# Patient Record
Sex: Male | Born: 1975 | Race: Black or African American | Hispanic: No | Marital: Single | Smoking: Current some day smoker
Health system: Southern US, Community
[De-identification: ages and names within clinical notes are randomized; demographics above are authoritative.]

## PROBLEM LIST (undated history)

## (undated) DIAGNOSIS — I509 Heart failure, unspecified: Secondary | ICD-10-CM

## (undated) DIAGNOSIS — F1490 Cocaine use, unspecified, uncomplicated: Secondary | ICD-10-CM

## (undated) DIAGNOSIS — I471 Supraventricular tachycardia, unspecified: Secondary | ICD-10-CM

## (undated) DIAGNOSIS — F32A Depression, unspecified: Secondary | ICD-10-CM

## (undated) DIAGNOSIS — F4310 Post-traumatic stress disorder, unspecified: Secondary | ICD-10-CM

## (undated) DIAGNOSIS — F419 Anxiety disorder, unspecified: Secondary | ICD-10-CM

## (undated) DIAGNOSIS — K259 Gastric ulcer, unspecified as acute or chronic, without hemorrhage or perforation: Secondary | ICD-10-CM

## (undated) DIAGNOSIS — K226 Gastro-esophageal laceration-hemorrhage syndrome: Secondary | ICD-10-CM

## (undated) DIAGNOSIS — R634 Abnormal weight loss: Secondary | ICD-10-CM

## (undated) DIAGNOSIS — F329 Major depressive disorder, single episode, unspecified: Secondary | ICD-10-CM

## (undated) DIAGNOSIS — I119 Hypertensive heart disease without heart failure: Secondary | ICD-10-CM

## (undated) DIAGNOSIS — I10 Essential (primary) hypertension: Secondary | ICD-10-CM

## (undated) DIAGNOSIS — E119 Type 2 diabetes mellitus without complications: Secondary | ICD-10-CM

## (undated) DIAGNOSIS — K219 Gastro-esophageal reflux disease without esophagitis: Secondary | ICD-10-CM

## (undated) DIAGNOSIS — K449 Diaphragmatic hernia without obstruction or gangrene: Secondary | ICD-10-CM

## (undated) DIAGNOSIS — J449 Chronic obstructive pulmonary disease, unspecified: Secondary | ICD-10-CM

## (undated) DIAGNOSIS — I8500 Esophageal varices without bleeding: Secondary | ICD-10-CM

## (undated) DIAGNOSIS — Z720 Tobacco use: Secondary | ICD-10-CM

## (undated) HISTORY — DX: Tobacco use: Z72.0

## (undated) HISTORY — DX: Gastro-esophageal laceration-hemorrhage syndrome: K22.6

## (undated) HISTORY — DX: Hypertensive heart disease without heart failure: I11.9

## (undated) HISTORY — DX: Chronic obstructive pulmonary disease, unspecified: J44.9

## (undated) HISTORY — DX: Cocaine use, unspecified, uncomplicated: F14.90

## (undated) HISTORY — DX: Supraventricular tachycardia: I47.1

## (undated) HISTORY — PX: FOOT SURGERY: SHX648

## (undated) HISTORY — DX: Abnormal weight loss: R63.4

## (undated) HISTORY — DX: Esophageal varices without bleeding: I85.00

## (undated) HISTORY — DX: Type 2 diabetes mellitus without complications: E11.9

## (undated) HISTORY — DX: Anxiety disorder, unspecified: F41.9

## (undated) HISTORY — DX: Gastric ulcer, unspecified as acute or chronic, without hemorrhage or perforation: K25.9

## (undated) HISTORY — DX: Diaphragmatic hernia without obstruction or gangrene: K44.9

## (undated) HISTORY — DX: Post-traumatic stress disorder, unspecified: F43.10

## (undated) HISTORY — DX: Supraventricular tachycardia, unspecified: I47.10

---

## 2001-03-26 ENCOUNTER — Emergency Department (HOSPITAL_COMMUNITY): Admission: EM | Admit: 2001-03-26 | Discharge: 2001-03-26 | Payer: Self-pay | Admitting: Emergency Medicine

## 2001-03-26 ENCOUNTER — Encounter: Payer: Self-pay | Admitting: Emergency Medicine

## 2001-04-03 HISTORY — PX: NASAL RECONSTRUCTION: SHX2069

## 2003-03-05 ENCOUNTER — Emergency Department (HOSPITAL_COMMUNITY): Admission: EM | Admit: 2003-03-05 | Discharge: 2003-03-06 | Payer: Self-pay | Admitting: Emergency Medicine

## 2006-07-02 ENCOUNTER — Emergency Department (HOSPITAL_COMMUNITY): Admission: EM | Admit: 2006-07-02 | Discharge: 2006-07-02 | Payer: Self-pay | Admitting: Emergency Medicine

## 2010-12-21 ENCOUNTER — Encounter: Payer: Self-pay | Admitting: *Deleted

## 2010-12-21 ENCOUNTER — Inpatient Hospital Stay (HOSPITAL_COMMUNITY)
Admission: EM | Admit: 2010-12-21 | Discharge: 2010-12-26 | DRG: 370 | Disposition: A | Discharge status: Home / Self Care | Payer: Self-pay | Attending: Internal Medicine | Admitting: Internal Medicine

## 2010-12-21 DIAGNOSIS — R51 Headache: Secondary | ICD-10-CM

## 2010-12-21 DIAGNOSIS — R519 Headache, unspecified: Secondary | ICD-10-CM | POA: Diagnosis present

## 2010-12-21 DIAGNOSIS — K922 Gastrointestinal hemorrhage, unspecified: Secondary | ICD-10-CM

## 2010-12-21 DIAGNOSIS — I10 Essential (primary) hypertension: Secondary | ICD-10-CM | POA: Diagnosis present

## 2010-12-21 DIAGNOSIS — I160 Hypertensive urgency: Secondary | ICD-10-CM

## 2010-12-21 DIAGNOSIS — K226 Gastro-esophageal laceration-hemorrhage syndrome: Principal | ICD-10-CM | POA: Diagnosis present

## 2010-12-21 DIAGNOSIS — R3129 Other microscopic hematuria: Secondary | ICD-10-CM

## 2010-12-21 DIAGNOSIS — F4310 Post-traumatic stress disorder, unspecified: Secondary | ICD-10-CM | POA: Diagnosis present

## 2010-12-21 DIAGNOSIS — K2100 Gastro-esophageal reflux disease with esophagitis, without bleeding: Secondary | ICD-10-CM | POA: Diagnosis present

## 2010-12-21 DIAGNOSIS — E860 Dehydration: Secondary | ICD-10-CM | POA: Diagnosis present

## 2010-12-21 DIAGNOSIS — K920 Hematemesis: Secondary | ICD-10-CM

## 2010-12-21 DIAGNOSIS — R45850 Homicidal ideations: Secondary | ICD-10-CM

## 2010-12-21 DIAGNOSIS — K319 Disease of stomach and duodenum, unspecified: Secondary | ICD-10-CM | POA: Diagnosis present

## 2010-12-21 DIAGNOSIS — I8500 Esophageal varices without bleeding: Secondary | ICD-10-CM | POA: Diagnosis present

## 2010-12-21 HISTORY — DX: Depression, unspecified: F32.A

## 2010-12-21 HISTORY — DX: Essential (primary) hypertension: I10

## 2010-12-21 HISTORY — DX: Gastro-esophageal reflux disease without esophagitis: K21.9

## 2010-12-21 HISTORY — DX: Major depressive disorder, single episode, unspecified: F32.9

## 2010-12-21 MED ORDER — SODIUM CHLORIDE 0.9 % IV BOLUS (SEPSIS)
500.0000 mL | Freq: Once | INTRAVENOUS | Status: AC
  Administered 2010-12-22: 1000 mL via INTRAVENOUS

## 2010-12-21 MED ORDER — PANTOPRAZOLE SODIUM 40 MG IV SOLR
40.0000 mg | Freq: Once | INTRAVENOUS | Status: AC
  Administered 2010-12-21: 40 mg via INTRAVENOUS
  Filled 2010-12-21: qty 40

## 2010-12-21 MED ORDER — SODIUM CHLORIDE 0.9 % IV SOLN: Freq: Once | INTRAVENOUS | Status: DC

## 2010-12-21 NOTE — ED Provider Notes (Addendum)
History     CSN: [id number] Arrival date & time: 12/21/2010 11:08 PM   Chief Complaint  Patient presents with  . Hematemesis     (Include location/radiation/quality/duration/timing/severity/associated sxs/prior treatment) HPI Comments: Seen 2309. Patient states he has had abdominal pain and vomiting of blood periodically for over a year.Pain located in the epigastric area with no radiation. Pain is continuous burning and aching.  He has not sought medical attention and has not taken medications.  Since Friday symptoms have been worse. Unable to take food of fluids without pain and  vomiting.Denies fever, chills, diarrhea, dizziness, syncope. Last BM was Friday.Has had dark stools for a "long time". Rare alcohol intake. No ASA or NSAID use. Recent weight loss of ?15 pounds.  Patient is a 35 y.o. male presenting with vomiting. The history is provided by the patient.  Emesis  Chronicity: since Friday unable to eat or drinking fluid has abdominal pain and vomiting blood.States he has had episodes of vomiting blood for over a year. Episode onset: Worse since Friday. The problem occurs 5 to 10 times per day. The problem has been gradually worsening. The emesis has an appearance of bright red blood. There has been no fever. Associated symptoms include abdominal pain. Pertinent negatives include no diarrhea. Associated symptoms comments: No BM since Friday.     Past Medical History  Diagnosis Date  . Hypertension      Past Surgical History  Procedure Date  . Foot surgery     History reviewed. No pertinent family history.  History  Substance Use Topics  . Smoking status: Current Everyday Smoker    Types: Cigarettes  . Smokeless tobacco: Not on file  . Alcohol Use: Yes     occ. use      Review of Systems  Constitutional: Positive for appetite change.  Gastrointestinal: Positive for nausea, vomiting and abdominal pain. Negative for diarrhea and blood in stool.       Vomiting blood   All other systems reviewed and are negative.    Allergies  Review of patient's allergies indicates no known allergies.  Home Medications  No current outpatient prescriptions on file.  Physical Exam    BP 154/98  Pulse 100  Temp(Src) 98.8 F (37.1 C) (Oral)  Resp 18  Ht 5\' 9"  (1.753 m)  Wt 251 lb 4 oz (113.966 kg)  BMI 37.10 kg/m2  SpO2 97%  Physical Exam  Constitutional: He appears well-developed and well-nourished. No distress.  HENT:  Head: Normocephalic and atraumatic.  Eyes: EOM are normal.  Neck: Normal range of motion.  Cardiovascular: Normal rate, normal heart sounds and intact distal pulses.   Pulmonary/Chest: Effort normal and breath sounds normal.  Abdominal: Soft. There is tenderness. There is no rebound and no guarding.       Epigastric discomfort with palpation.    ED Course  Procedures  Results for orders placed during the hospital encounter of 12/21/10  CBC      Component Value Range   WBC 7.8  4.0 - 10.5 (K/uL)   RBC 5.44  4.22 - 5.81 (MIL/uL)   Hemoglobin 17.0  13.0 - 17.0 (g/dL)   HCT 40.9  81.1 - 91.4 (%)   MCV 91.0  78.0 - 100.0 (fL)   MCH 31.3  26.0 - 34.0 (pg)   MCHC 34.3  30.0 - 36.0 (g/dL)   RDW 78.2  95.6 - 21.3 (%)   Platelets 236  150 - 400 (K/uL)  DIFFERENTIAL      Component  Value Range   Neutrophils Relative 60  43 - 77 (%)   Neutro Abs 4.7  1.7 - 7.7 (K/uL)   Lymphocytes Relative 28  12 - 46 (%)   Lymphs Abs 2.2  0.7 - 4.0 (K/uL)   Monocytes Relative 11  3 - 12 (%)   Monocytes Absolute 0.9  0.1 - 1.0 (K/uL)   Eosinophils Relative 0  0 - 5 (%)   Eosinophils Absolute 0.0  0.0 - 0.7 (K/uL)   Basophils Relative 0  0 - 1 (%)   Basophils Absolute 0.0  0.0 - 0.1 (K/uL)  COMPREHENSIVE METABOLIC PANEL      Component Value Range   Sodium 139  135 - 145 (mEq/L)   Potassium 3.6  3.5 - 5.1 (mEq/L)   Chloride 104  96 - 112 (mEq/L)   CO2 24  19 - 32 (mEq/L)   Glucose, Bld 103 (*) 70 - 99 (mg/dL)   BUN 10  6 - 23 (mg/dL)    Creatinine, Ser 1.61  0.50 - 1.35 (mg/dL)   Calcium 9.4  8.4 - 09.6 (mg/dL)   Total Protein 7.6  6.0 - 8.3 (g/dL)   Albumin 4.2  3.5 - 5.2 (g/dL)   AST 34  0 - 37 (U/L)   ALT 20  0 - 53 (U/L)   Alkaline Phosphatase 81  39 - 117 (U/L)   Total Bilirubin 0.5  0.3 - 1.2 (mg/dL)   GFR calc non Af Amer >60  >60 (mL/min)   GFR calc Af Amer >60  >60 (mL/min)  LIPASE, BLOOD      Component Value Range   Lipase 14  11 - 59 (U/L)  URINALYSIS, MICROSCOPIC ONLY      Component Value Range   Color, Urine BROWN (*) YELLOW    Appearance CLEAR  CLEAR    Specific Gravity, Urine >1.030 (*) 1.005 - 1.030    pH 6.0  5.0 - 8.0    Glucose, UA NEGATIVE  NEGATIVE (mg/dL)   Hgb urine dipstick TRACE (*) NEGATIVE    Bilirubin Urine MODERATE (*) NEGATIVE    Ketones, ur >80 (*) NEGATIVE (mg/dL)   Protein, ur TRACE (*) NEGATIVE (mg/dL)   Urobilinogen, UA 1.0  0.0 - 1.0 (mg/dL)   Nitrite NEGATIVE  NEGATIVE    Leukocytes, UA NEGATIVE  NEGATIVE    WBC, UA 0-2  <3 (WBC/hpf)   RBC / HPF 21-50  <3 (RBC/hpf)   Bacteria, UA FEW (*) RARE    Squamous Epithelial / LPF RARE  RARE   TYPE AND SCREEN      Component Value Range   ABO/RH(D) O POS     Antibody Screen NEG     Sample Expiration 12/24/2010     Patient with vomiting blood since Friday. H/o GERD in 2008. No GI follow up. Post NG tube placement suction of 100 ml bright red blood. NG plugged. Patient given protonix and zofran. Received IVF. Labs unremarkable. Hgb stable. Nausea relieved. No further vomiting.Pt feels improved after observation and/or treatment in ED.Pt stable in ED with no significant deterioration in condition.  1 Spoke with Dr. Lahoma Crocker. She advised d/c NG. She asked that hospitalist admit patient. She will see him later today. Advised use of zofran Q6 hours and protonix BID. 0454 Spoke with Dr. Orvan Falconer, hospitalist. He accepted for admission to telemetry. Patient and mother  informed of clinical course, understand medical  decision-making process, and agree with plan. MDM Reviewed: nursing note and vitals Interpretation: labs Total time  providing critical care: 30-74 minutes. This excludes time spent performing separately reportable procedures and services. Consults: admitting MD and gastrointestinal  The patient appears reasonably stabilized for admission considering the current resources, flow, and capabilities available in the ED at this time, and I doubt any other [facility] requiring further screening and/or treatment in the ED prior to admission.   Nicoletta Dress. Colon Branch, MD 12/22/10 1610  Nicoletta Dress. Colon Branch, MD 12/22/10 [phone]

## 2010-12-21 NOTE — ED Notes (Signed)
Patient states that he is coughing up blood "for a long time", unable to state for how long

## 2010-12-22 ENCOUNTER — Encounter (HOSPITAL_COMMUNITY): Payer: Self-pay | Admitting: Internal Medicine

## 2010-12-22 ENCOUNTER — Encounter (HOSPITAL_COMMUNITY)
Admission: EM | Disposition: A | Discharge status: Home / Self Care | Payer: Self-pay | Source: Home / Self Care | Attending: Internal Medicine

## 2010-12-22 ENCOUNTER — Other Ambulatory Visit: Payer: Self-pay | Admitting: Internal Medicine

## 2010-12-22 ENCOUNTER — Emergency Department (HOSPITAL_COMMUNITY): Payer: Self-pay

## 2010-12-22 DIAGNOSIS — K259 Gastric ulcer, unspecified as acute or chronic, without hemorrhage or perforation: Secondary | ICD-10-CM

## 2010-12-22 DIAGNOSIS — K920 Hematemesis: Secondary | ICD-10-CM | POA: Diagnosis present

## 2010-12-22 DIAGNOSIS — I8500 Esophageal varices without bleeding: Secondary | ICD-10-CM

## 2010-12-22 DIAGNOSIS — R51 Headache: Secondary | ICD-10-CM | POA: Diagnosis present

## 2010-12-22 DIAGNOSIS — K2100 Gastro-esophageal reflux disease with esophagitis, without bleeding: Secondary | ICD-10-CM

## 2010-12-22 DIAGNOSIS — R109 Unspecified abdominal pain: Secondary | ICD-10-CM

## 2010-12-22 DIAGNOSIS — E860 Dehydration: Secondary | ICD-10-CM | POA: Diagnosis present

## 2010-12-22 DIAGNOSIS — I160 Hypertensive urgency: Secondary | ICD-10-CM | POA: Diagnosis present

## 2010-12-22 DIAGNOSIS — R3129 Other microscopic hematuria: Secondary | ICD-10-CM | POA: Diagnosis present

## 2010-12-22 DIAGNOSIS — R519 Headache, unspecified: Secondary | ICD-10-CM | POA: Diagnosis present

## 2010-12-22 DIAGNOSIS — K226 Gastro-esophageal laceration-hemorrhage syndrome: Secondary | ICD-10-CM

## 2010-12-22 DIAGNOSIS — K449 Diaphragmatic hernia without obstruction or gangrene: Secondary | ICD-10-CM

## 2010-12-22 DIAGNOSIS — K2960 Other gastritis without bleeding: Secondary | ICD-10-CM

## 2010-12-22 HISTORY — DX: Gastric ulcer, unspecified as acute or chronic, without hemorrhage or perforation: K25.9

## 2010-12-22 HISTORY — PX: ESOPHAGOGASTRODUODENOSCOPY: SHX5428

## 2010-12-22 HISTORY — DX: Diaphragmatic hernia without obstruction or gangrene: K44.9

## 2010-12-22 HISTORY — DX: Esophageal varices without bleeding: I85.00

## 2010-12-22 HISTORY — DX: Gastro-esophageal laceration-hemorrhage syndrome: K22.6

## 2010-12-22 LAB — COMPREHENSIVE METABOLIC PANEL
ALT: 20 U/L (ref 0–53)
AST: 34 U/L (ref 0–37)
Albumin: 4.2 g/dL (ref 3.5–5.2)
Alkaline Phosphatase: 81 U/L (ref 39–117)
BUN: 10 mg/dL (ref 6–23)
CO2: 24 mEq/L (ref 19–32)
Calcium: 9.4 mg/dL (ref 8.4–10.5)
Chloride: 104 mEq/L (ref 96–112)
Creatinine, Ser: 1.11 mg/dL (ref 0.50–1.35)
GFR calc Af Amer: >60 mL/min (ref >60)
GFR calc non Af Amer: >60 mL/min (ref >60)
Glucose, Bld: 103 mg/dL — ABNORMAL HIGH (ref 70–99)
Potassium: 3.6 mEq/L (ref 3.5–5.1)
Sodium: 139 mEq/L (ref 135–145)
Total Bilirubin: 0.5 mg/dL (ref 0.3–1.2)
Total Protein: 7.6 g/dL (ref 6.0–8.3)

## 2010-12-22 LAB — HEMOGLOBIN AND HEMATOCRIT, BLOOD
HCT: 46.1 % (ref 39.0–52.0)
HCT: 48.1 % (ref 39.0–52.0)
HCT: 45.1 % (ref 39.0–52.0)
Hemoglobin: 15.3 g/dL (ref 13.0–17.0)
Hemoglobin: 15.6 g/dL (ref 13.0–17.0)
Hemoglobin: 15 g/dL (ref 13.0–17.0)

## 2010-12-22 LAB — DIFFERENTIAL
Basophils Absolute: 0 10*3/uL (ref 0.0–0.1)
Basophils Relative: 0 % (ref 0–1)
Eosinophils Absolute: 0 10*3/uL (ref 0.0–0.7)
Eosinophils Relative: 0 % (ref 0–5)
Lymphocytes Relative: 28 % (ref 12–46)
Lymphs Abs: 2.2 10*3/uL (ref 0.7–4.0)
Monocytes Absolute: 0.9 10*3/uL (ref 0.1–1.0)
Monocytes Relative: 11 % (ref 3–12)
Neutro Abs: 4.7 10*3/uL (ref 1.7–7.7)
Neutrophils Relative %: 60 % (ref 43–77)

## 2010-12-22 LAB — URINALYSIS, ROUTINE W REFLEX MICROSCOPIC
Glucose, UA: NEGATIVE mg/dL
Ketones, ur: >80 mg/dL — AB
Leukocytes, UA: NEGATIVE
Nitrite: NEGATIVE
Protein, ur: NEGATIVE mg/dL
Specific Gravity, Urine: 1.03 (ref 1.005–1.030)
Urobilinogen, UA: 1 mg/dL (ref 0.0–1.0)
pH: 6 (ref 5.0–8.0)

## 2010-12-22 LAB — URINALYSIS, MICROSCOPIC ONLY
Glucose, UA: NEGATIVE mg/dL
Ketones, ur: >80 mg/dL — AB
Leukocytes, UA: NEGATIVE
Nitrite: NEGATIVE
Specific Gravity, Urine: >1.03 — ABNORMAL HIGH (ref 1.005–1.030)
Urobilinogen, UA: 1 mg/dL (ref 0.0–1.0)
pH: 6 (ref 5.0–8.0)

## 2010-12-22 LAB — CBC
HCT: 49.5 % (ref 39.0–52.0)
Hemoglobin: 17 g/dL (ref 13.0–17.0)
MCH: 31.3 pg (ref 26.0–34.0)
MCHC: 34.3 g/dL (ref 30.0–36.0)
MCV: 91 fL (ref 78.0–100.0)
Platelets: 236 10*3/uL (ref 150–400)
RBC: 5.44 MIL/uL (ref 4.22–5.81)
RDW: 13.8 % (ref 11.5–15.5)
WBC: 7.8 10*3/uL (ref 4.0–10.5)

## 2010-12-22 LAB — PROTIME-INR
INR: 1.09 (ref 0.00–1.49)
Prothrombin Time: 14.3 seconds (ref 11.6–15.2)

## 2010-12-22 LAB — LIPASE, BLOOD
Lipase: 14 U/L (ref 11–59)
Lipase: 15 U/L (ref 11–59)

## 2010-12-22 LAB — URINE MICROSCOPIC-ADD ON

## 2010-12-22 LAB — TSH: TSH: 0.553 u[IU]/mL (ref 0.350–4.500)

## 2010-12-22 LAB — TYPE AND SCREEN
ABO/RH(D): O POS
Antibody Screen: NEGATIVE

## 2010-12-22 LAB — APTT: aPTT: 41 seconds — ABNORMAL HIGH (ref 24–37)

## 2010-12-22 SURGERY — EGD (ESOPHAGOGASTRODUODENOSCOPY)
Anesthesia: Moderate Sedation

## 2010-12-22 MED ORDER — BUTAMBEN-TETRACAINE-BENZOCAINE 2-2-14 % EX AERO
INHALATION_SPRAY | CUTANEOUS | Status: DC | PRN
  Administered 2010-12-22: 2 via TOPICAL

## 2010-12-22 MED ORDER — MEPERIDINE HCL 100 MG/ML IJ SOLN
INTRAMUSCULAR | Status: AC
  Filled 2010-12-22: qty 2

## 2010-12-22 MED ORDER — PROMETHAZINE HCL 25 MG/ML IJ SOLN
INTRAMUSCULAR | Status: AC
  Filled 2010-12-22: qty 1

## 2010-12-22 MED ORDER — STERILE WATER FOR IRRIGATION IR SOLN
Status: DC | PRN
  Administered 2010-12-22: 14:00:00

## 2010-12-22 MED ORDER — MEPERIDINE HCL 100 MG/ML IJ SOLN
INTRAMUSCULAR | Status: DC | PRN
  Administered 2010-12-22 (×2): 50 mg via INTRAVENOUS

## 2010-12-22 MED ORDER — MIDAZOLAM HCL 5 MG/5ML IJ SOLN
INTRAMUSCULAR | Status: AC
  Filled 2010-12-22: qty 10

## 2010-12-22 MED ORDER — ONDANSETRON HCL 4 MG/2ML IJ SOLN
4.0000 mg | Freq: Four times a day (QID) | INTRAMUSCULAR | Status: DC
  Administered 2010-12-22 – 2010-12-25 (×6): 4 mg via INTRAVENOUS
  Filled 2010-12-22 (×6): qty 2

## 2010-12-22 MED ORDER — FLEET ENEMA 7-19 GM/118ML RE ENEM: 1.0000 | ENEMA | RECTAL | Status: DC | PRN

## 2010-12-22 MED ORDER — PANTOPRAZOLE SODIUM 40 MG IV SOLR: 40.0000 mg | Freq: Once | INTRAVENOUS | Status: DC

## 2010-12-22 MED ORDER — PROMETHAZINE HCL 25 MG/ML IJ SOLN: 12.5000 mg | Freq: Four times a day (QID) | INTRAMUSCULAR | Status: DC | PRN

## 2010-12-22 MED ORDER — SUCRALFATE 1 GM/10ML PO SUSP
1.0000 g | Freq: Three times a day (TID) | ORAL | Status: DC
  Administered 2010-12-22 – 2010-12-26 (×16): 1 g via ORAL
  Filled 2010-12-22 (×16): qty 10

## 2010-12-22 MED ORDER — SODIUM CHLORIDE 0.9 % IJ SOLN
INTRAMUSCULAR | Status: AC
  Filled 2010-12-22: qty 10

## 2010-12-22 MED ORDER — SODIUM CHLORIDE 0.9 % IV BOLUS (SEPSIS)
1000.0000 mL | Freq: Once | INTRAVENOUS | Status: AC
  Administered 2010-12-22: 1000 mL via INTRAVENOUS

## 2010-12-22 MED ORDER — PROMETHAZINE HCL 25 MG/ML IJ SOLN
INTRAMUSCULAR | Status: DC | PRN
  Administered 2010-12-22 (×2): 12.5 mg via INTRAVENOUS

## 2010-12-22 MED ORDER — ONDANSETRON HCL 4 MG/2ML IJ SOLN
4.0000 mg | Freq: Once | INTRAMUSCULAR | Status: AC
  Administered 2010-12-22: 4 mg via INTRAVENOUS
  Filled 2010-12-22: qty 2

## 2010-12-22 MED ORDER — SODIUM CHLORIDE 0.45 % IV SOLN
Freq: Once | INTRAVENOUS | Status: AC
  Administered 2010-12-22: 20 mL via INTRAVENOUS

## 2010-12-22 MED ORDER — MIDAZOLAM HCL 5 MG/5ML IJ SOLN
INTRAMUSCULAR | Status: DC | PRN
  Administered 2010-12-22 (×2): 2 mg via INTRAVENOUS

## 2010-12-22 MED ORDER — POLYETHYLENE GLYCOL 3350 17 G PO PACK
17.0000 g | PACK | Freq: Every day | ORAL | Status: DC | PRN
  Administered 2010-12-25: 17 g via ORAL
  Filled 2010-12-22 (×2): qty 1

## 2010-12-22 MED ORDER — SODIUM CHLORIDE 0.9 % IV SOLN
INTRAVENOUS | Status: DC
  Filled 2010-12-22 (×2): qty 1000

## 2010-12-22 MED ORDER — POTASSIUM CHLORIDE IN NACL 20-0.9 MEQ/L-% IV SOLN
INTRAVENOUS | Status: DC
  Administered 2010-12-22: 1000 mL via INTRAVENOUS
  Administered 2010-12-22: 150 mL via INTRAVENOUS
  Administered 2010-12-23: 1000 mL via INTRAVENOUS
  Administered 2010-12-23 (×2): via INTRAVENOUS
  Administered 2010-12-24: 10 mL/h via INTRAVENOUS

## 2010-12-22 MED ORDER — PROMETHAZINE HCL 12.5 MG PO TABS: 12.5000 mg | ORAL_TABLET | Freq: Four times a day (QID) | ORAL | Status: DC | PRN

## 2010-12-22 MED ORDER — ONDANSETRON HCL 4 MG PO TABS
4.0000 mg | ORAL_TABLET | Freq: Four times a day (QID) | ORAL | Status: DC
  Administered 2010-12-22 – 2010-12-26 (×11): 4 mg via ORAL
  Filled 2010-12-22 (×11): qty 1

## 2010-12-22 MED ORDER — HYDROMORPHONE HCL 1 MG/ML IJ SOLN
0.5000 mg | INTRAMUSCULAR | Status: DC | PRN
  Administered 2010-12-22: 0.5 mg via INTRAVENOUS
  Administered 2010-12-23: 12:00:00 via INTRAVENOUS
  Administered 2010-12-23 – 2010-12-25 (×14): 0.5 mg via INTRAVENOUS
  Filled 2010-12-22 (×17): qty 1

## 2010-12-22 MED ORDER — BISACODYL 10 MG RE SUPP: 10.0000 mg | RECTAL | Status: DC | PRN

## 2010-12-22 MED ORDER — PANTOPRAZOLE SODIUM 40 MG IV SOLR
40.0000 mg | Freq: Two times a day (BID) | INTRAVENOUS | Status: DC
  Administered 2010-12-22 – 2010-12-25 (×7): 40 mg via INTRAVENOUS
  Filled 2010-12-22 (×7): qty 40

## 2010-12-22 NOTE — Consult Note (Cosign Needed)
Referring Provider: Dr. Vania Rea, [facility] Primary Care Physician:  No primary provider on file. Primary Gastroenterologist:  Roetta Sessions, MD   Reason for Consultation:  Hematemesis  HPI: Miguel Elliott is a 35 y.o. male admitted with complaints of hematemesis. States he hasn't felt well over the last few weeks. Describes anorexia. Really got sick starting last Friday. Started vomiting. The first episode he had hematemesis. Cannot keep anything down. Vomits after drinking a small amount of water. 3-4 episodes daily. Came to emergency department yesterday after he had possible syncopal episode.   In the emergency department he reportedly had 200 cc of bloody gastric secretions via NG tube, described as thin bloody secretions. Patient has had not had any further vomiting since he has been on Zofran around the clock.  He reports 20 pound weight loss in the last one week. Last BM few nights ago, liquidy. Intermittent black stools. No constipation/diarrhea. No abdominal pain. Denies ASA, BC/Goody's powder, NSAIDS. No heartburn now. Used to get it heartburn really bad in previous years. No dysphagia or odynophagia.  States he started back using marijuana last week because of anorexia. Used to use daily but that was nearly 2 years ago. Had been 9 months since he used any at all. Previously a heavy drinker but that was years ago. Smokes cigarettes daily, one pack per day. States he's been under a lot of stress. Recently broke up with his children's mother. History of hypertension but on no medication, which presented to emergency department he had blood pressure with systolic in the 190 and diastolic in the 100s.   Also reports waking up in the morning almost daily for the last one year with a teaspoonful of blood in his mouth.   Prior to Admission medications   Not on File    Current Facility-Administered Medications  Medication Dose Route Frequency Provider Last Rate Last Dose  .  0.9 % NaCl with KCl 20 mEq/ L  infusion   Intravenous Continuous Leopold Campbell 150 mL/hr at 12/22/10 0619 1,000 mL at 12/22/10 0619  . bisacodyl (DULCOLAX) suppository 10 mg  10 mg Rectal Q48H PRN Vania Rea      . HYDROmorphone (DILAUDID) injection 0.5 mg  0.5 mg Intravenous Q2H PRN Vania Rea      . ondansetron (ZOFRAN) injection 4 mg  4 mg Intravenous Once [employer]. Colon Branch, MD   4 mg at 12/22/10 0108  . ondansetron (ZOFRAN) tablet 4 mg  4 mg Oral Q6H Leopold Campbell       Or  . ondansetron [facility]) injection 4 mg  4 mg Intravenous Q6H Leopold Campbell   4 mg at 12/22/10 1610  . pantoprazole (PROTONIX) injection 40 mg  40 mg Intravenous Once [employer]. Colon Branch, MD   40 mg at 12/21/10 2355  . pantoprazole (PROTONIX) injection 40 mg  40 mg Intravenous Q12H Vania Rea      . polyethylene glycol (MIRALAX / GLYCOLAX) packet 17 g  17 g Oral Daily PRN Vania Rea      . promethazine (PHENERGAN) tablet 12.5 mg  12.5 mg Oral Q6H PRN Vania Rea       Or  . promethazine (PHENERGAN) injection 12.5 mg  12.5 mg Intravenous Q6H PRN Vania Rea      . sodium chloride 0.9 % bolus 500 mL  500 mL Intravenous Once Nicoletta Dress. Colon Branch, MD   1,000 mL at 12/22/10 0012  . sodium phosphate (FLEET) 7-19 GM/118ML enema 1 enema  1 enema Rectal  Q48H PRN Vania Rea      . DISCONTD: 0.9 %  sodium chloride infusion   Intravenous Once [employer]. Colon Branch, MD      . DISCONTD: pantoprazole (PROTONIX) injection 40 mg  40 mg Intravenous Once [employer]. Colon Branch, MD      . DISCONTD: sodium chloride 0.9 % 1,000 mL with potassium chloride 20 mEq infusion   Intravenous Continuous Vania Rea        Allergies as of 12/21/2010  . (No Known Allergies)    Past Medical History  Diagnosis Date  . Hypertension     Past Surgical History  Procedure Date  . Foot surgery     Family History  Problem Relation Age of Onset  . Hypertension Father   . Diabetes Father   . Hypertension Paternal  Grandmother   . Diabetes Maternal Grandmother   . Colon cancer Neg Hx   . Ulcers Neg Hx     History   Social History  . Marital Status: Single    Spouse Name: N/A    Number of Children: 2  . Years of Education: N/A   Occupational History  . unemployed    Social History Main Topics  . Smoking status: Current Everyday Smoker -- 1.0 packs/day    Types: Cigarettes  . Smokeless tobacco: Not on file  . Alcohol Use: Yes     last etoh 2-3 weeks ago, not heavy in years  . Drug Use: Yes    Special: Marijuana     started using again last Wed, used on daily basis two years ago  . Sexually Active: Yes   Other Topics Concern  . Not on file   Social History Narrative  . No narrative on file     ROS:  General: See history of present illness. Negative for fever, chills, fatigue, weakness. Eyes: Negative for vision changes.  ENT: Negative for hoarseness, difficulty swallowing , nasal congestion. CV: Negative for chest pain, angina, palpitations, dyspnea on exertion, peripheral edema.  Respiratory: Negative for dyspnea at rest, dyspnea on exertion, cough, sputum, wheezing.  GI: See history of present illness. GU:  Negative for dysuria, hematuria, urinary incontinence, urinary frequency, nocturnal urination. Complains of decreased urination, dark urine.  MS: Negative for joint pain, low back pain.  Derm: Negative for rash or itching.  Neuro: Negative for weakness, abnormal sensation, seizure, frequent headaches, memory loss, confusion.  Psych: Negative for anxiety, depression, suicidal ideation, hallucinations.  Endo: Reports a 20 pound weight loss in one week.  Heme: Negative for bruising or bleeding. Allergy: Negative for rash or hives.       Physical Examination: Vital signs in last 24 hours: Temp:  [98 F (36.7 C)-99.1 F (37.3 C)] 98 F (36.7 C) (09/20 0541) Pulse Rate:  [80-102] 83  (09/20 0825) Resp:  [18-20] 20  (09/20 0541) BP: (151-192)/(74-119) 178/94 mmHg (09/20  0541) SpO2:  [96 %-98 %] 97 % (09/20 0825) Weight:  [250 lb 10.6 oz (113.7 kg)-251 lb 4 oz (113.966 kg)] 250 lb 10.6 oz (113.7 kg) (09/20 0438) Last BM Date: 12/15/10  General: Well-nourished, well-developed in no acute distress.  Head: Normocephalic, atraumatic.   Eyes: Conjunctiva pink, no icterus. Mouth: Oropharyngeal mucosa moist and pink , no lesions erythema or exudate. Neck: Supple without thyromegaly, masses, or lymphadenopathy.  Lungs: Clear to auscultation bilaterally.  Heart: Regular rate and rhythm, no murmurs rubs or gallops.  Abdomen: Bowel sounds are normal, mild epigastric tenderness, nondistended, no hepatosplenomegaly or masses, no abdominal  bruits or    hernia , no rebound or guarding.   Rectal: Not performed. Extremities: No lower extremity edema, clubbing, deformity.  Neuro: Alert and oriented x 4 , grossly normal neurologically.  Skin: Warm and dry, no rash or jaundice.   Psych: Alert and cooperative, normal mood and affect.        Intake/Output from previous day:   Intake/Output this shift:    Lab Results: CBC  Basename 12/22/10 0526 12/21/10 2350  WBC -- 7.8  HGB 15.3 17.0  HCT 46.1 49.5  MCV -- 91.0  PLT -- 236   BMET  Basename 12/21/10 2350  NA 139  K 3.6  CL 104  CO2 24  GLUCOSE 103*  BUN 10  CREATININE 1.11  CALCIUM 9.4   LFT  Basename 12/21/10 2350  BILITOT 0.5  BILIDIR --  IBILI --  ALKPHOS 81  AST 34  ALT 20  PROT 7.6  ALBUMIN 4.2     PT/INR  Basename 12/22/10 0526  LABPROT 14.3  INR 1.09   Imaging Studies: X-ray Chest Pa And Lateral   12/22/2010  *RADIOLOGY REPORT*  Clinical Data: Hemoptysis, hypertensive urgency.  CHEST - 2 VIEW  Comparison: None.  Findings: Eventration right hemidiaphragm.  No focal consolidation, pleural effusion, or pneumothorax.  Cardiomediastinal contours are within the upper normal limits.  No acute osseous abnormality.  IMPRESSION: No acute process identified.  Original Report Authenticated By:  Waneta Martins, M.D.  Pierre.Alas week]   Impression: 35 year old gentleman with couple week history of anorexia followed by reported hematemesis several times in the last week. States he saw blood in the first episode of vomiting. He denies abdominal pain although has some epigastric tenderness on exam. Denies NSAID or aspirin use. Prior history of severe heartburn but denies any recently. Blood pressure has been significantly elevated, noncompliance with treatment for hypertension for years. This may be contributing to n/v.  He needs to have his upper GI tract evaluated for severe esophagitis, peptic ulcer disease, Mallory-Weiss tear.  Plan: #1 EGD today.  #2 Continue around-the-clock Zofran for now. #3 Continue IV Protonix #4 Further recommendation to follow. #5 Add on lipase.   LOS: 1 day   Tana Coast  12/22/2010, 8:56 AM

## 2010-12-22 NOTE — H&P (Signed)
PCP:  unassigned   Chief Complaint:  Vomiting blood x 3 days  HPI: 35-yo AAM presents c/o vomiting clots of blood x 5 days. Reports principal symptom of nausea and anorexia and vomiting wheeler he attempts to eat or drink anything, even a small amount of water. Has vomited 3-4 times each day. Eventually he started to feel dizzy and came to ED to assistance.  Also reports episodic waking up with a teaspoonful of blood in his mouth most mornings for the past year, Has episodic sorenes of throat.with episodic black stool. Has not had black stool for about 2 weeks, has not had a bowel movement of the past 6 days - he feels this is because he has not eaten anything. NG tube aspiration of stomach in ED, without saline lavage, returned 150 cc of thin bloody gastric secretions.  He does have a history of hypertension since age 1 but is non-compliant with medication because of side effects such as dehydration and blurred vision. Reports Norvasc seems to help him best.  Has chronic headaches, but denies use of ASA,  NSAID'S, Tylenol, or any other OTC product.  Denies abdominal pain; does have episodic chest pain, though not at present.   Complains of thirst and dark colored urine.   Review of Systems:  The patient denies fever, weight loss,, vision loss, decreased hearing, hoarseness, syncope, dyspnea on exertion, peripheral edema, balance deficits, hemoptysis, abdominal pain, hematochezia, severe indigestion/heartburn, hematuria, incontinence, genital sores, muscle weakness, suspicious skin lesions, transient blindness, difficulty walking, depression, unusual weight change, abnormal bleeding, enlarged lymph nodes, angioedema, and breast masses.  Past Medical History: Past Medical History  Diagnosis Date  . Hypertension    Past Surgical History  Procedure Date  . Foot surgery     Medications: Prior to Admission medications   Not on File    Allergies:  No Known Allergies  Social  History:  reports that he has been smoking Cigarettes.  He does not have any smokeless tobacco history on file. He reports that he drinks alcohol. He reports that he uses illicit drugs (Marijuana). Smokes 1 pack/week; occasional alcohol use; denies recent use. 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  Family History: Family History  Problem Relation Age of Onset  . Hypertension Father   . Diabetes Father   . Hypertension Paternal Grandmother   . Diabetes Maternal Grandmother     Physical Exam: Filed Vitals:   12/21/10 2351 12/21/10 2352 12/21/10 2353 12/22/10 0233  BP: 192/109 173/119 178/112 174/99  Pulse: 102 101 100 95  Temp:      TempSrc:      Resp:    20  Height:      Weight:      SpO2:    98%   General appearance: alert, muscular, athlethic and moderately obese Head: Normocephalic, without obvious abnormality, atraumatic Eyes: conjunctivae/corneas clear. Dry; PERRL, EOM's intact.  Nose: Nares normal. Septum midline. Mucosa normal. No drainage or sinus tenderness; no blood Throat: lips, mucosa, and tongue normal; teeth and gums normal, dehydrated. Neck: no adenopathy, no carotid bruit, no JVD, supple, symmetrical, trachea midline and thyroid not enlarged, symmetric, no tenderness/mass/nodules Back: symmetric, no curvature. ROM normal. No CVA tenderness. Resp: clear to auscultation bilaterally Chest wall: no tenderness Cardio: regular rate and rhythm, no murmur Extremities: extremities normal, atraumatic, no cyanosis or edema Pulses: 2+ and symmetric Skin: Skin color, texture, turgor normal. No rashes or lesions Neurologic: Alert and oriented X 3,  normal strength and tone. Normal symmetric reflexes. Normal coordination and gait   Labs on Admission:   [facility] 12/21/10 2350  NA 139  K 3.6  CL 104  CO2 24  GLUCOSE 103*  BUN 10  CREATININE 1.11  CALCIUM 9.4  MG --  PHOS --    Basename 12/21/10 2350  AST 34    ALT 20  ALKPHOS 81  BILITOT 0.5  PROT 7.6  ALBUMIN 4.2    Basename 12/21/10 2350  LIPASE 14  AMYLASE --    Basename 12/21/10 2350  WBC 7.8  NEUTROABS 4.7  HGB 17.0  HCT 49.5  MCV 91.0  PLT 236   Urine:  SG >1.030, Ketones >80, RBC's 21-50  Radiological Exams on Admission: No results found.  Assessment/Plan Present on Admission:  .Hematemesis .Hypertensive urgency .Headache .Dehydration .Hematuria, microscopic  Hematemesis of unclear etiology; possibly Mallory-Weiss;  ?ue to swallowed blood; will keep NPO and await GI evaluation. Will check coags.  Hypertension; Hypertensive urgency ,factitious, resolved with use of larger BP cuff. May need to restart amlodipine after GI problems resolved. Will check CXR and EKG  Will hydrate and recheck urinalysis tomorrow. If hematuria persists may need urology and  Vasculitis work up.   Other plans as per orders CAMPBELL,LEOPOLD 12/22/2010, 3:30 AM

## 2010-12-22 NOTE — ED Notes (Signed)
Discontinued ng tube per dr strand. of bloody vomitus

## 2010-12-22 NOTE — Progress Notes (Signed)
Patient somnolent status post endoscopy/EGD. He denies any nausea vomiting, or hematemesis. His hemoglobin is remaining stable. Appreciate GI assistance, we'll continue current management plan as per Dr Orvan Falconer in followup on studies and further manage as appropriate.

## 2010-12-23 ENCOUNTER — Inpatient Hospital Stay (HOSPITAL_COMMUNITY): Payer: Self-pay

## 2010-12-23 ENCOUNTER — Encounter (HOSPITAL_COMMUNITY): Payer: Self-pay

## 2010-12-23 LAB — BASIC METABOLIC PANEL
BUN: 8 mg/dL (ref 6–23)
CO2: 29 mEq/L (ref 19–32)
Calcium: 8.5 mg/dL (ref 8.4–10.5)
Chloride: 107 mEq/L (ref 96–112)
Creatinine, Ser: 1.27 mg/dL (ref 0.50–1.35)
GFR calc Af Amer: >60 mL/min (ref >60)
GFR calc non Af Amer: >60 mL/min (ref >60)
Glucose, Bld: 103 mg/dL — ABNORMAL HIGH (ref 70–99)
Potassium: 4 mEq/L (ref 3.5–5.1)
Sodium: 141 mEq/L (ref 135–145)

## 2010-12-23 LAB — CBC
HCT: 46.5 % (ref 39.0–52.0)
Hemoglobin: 15.1 g/dL (ref 13.0–17.0)
MCH: 30.3 pg (ref 26.0–34.0)
MCHC: 32.5 g/dL (ref 30.0–36.0)
MCV: 93.4 fL (ref 78.0–100.0)
Platelets: 228 10*3/uL (ref 150–400)
RBC: 4.98 MIL/uL (ref 4.22–5.81)
RDW: 13.9 % (ref 11.5–15.5)
WBC: 5.9 10*3/uL (ref 4.0–10.5)

## 2010-12-23 LAB — HEMOGLOBIN AND HEMATOCRIT, BLOOD
HCT: 45 % (ref 39.0–52.0)
HCT: 47 % (ref 39.0–52.0)
HCT: 44.6 % (ref 39.0–52.0)
Hemoglobin: 15 g/dL (ref 13.0–17.0)
Hemoglobin: 15.5 g/dL (ref 13.0–17.0)
Hemoglobin: 14.8 g/dL (ref 13.0–17.0)

## 2010-12-23 LAB — URINALYSIS, ROUTINE W REFLEX MICROSCOPIC
Bilirubin Urine: NEGATIVE
Glucose, UA: NEGATIVE mg/dL
Ketones, ur: NEGATIVE mg/dL
Leukocytes, UA: NEGATIVE
Nitrite: NEGATIVE
Protein, ur: NEGATIVE mg/dL
Specific Gravity, Urine: 1.02 (ref 1.005–1.030)
Urobilinogen, UA: 1 mg/dL (ref 0.0–1.0)
pH: 6.5 (ref 5.0–8.0)

## 2010-12-23 LAB — URINE MICROSCOPIC-ADD ON

## 2010-12-23 MED ORDER — DEXTROSE 5 % IV SOLN
1.0000 g | INTRAVENOUS | Status: DC
  Administered 2010-12-23 – 2010-12-24 (×2): 1 g via INTRAVENOUS
  Filled 2010-12-23 (×3): qty 1

## 2010-12-23 MED ORDER — AMLODIPINE BESYLATE 5 MG PO TABS
10.0000 mg | ORAL_TABLET | Freq: Every day | ORAL | Status: DC
  Administered 2010-12-23 – 2010-12-26 (×4): 10 mg via ORAL
  Filled 2010-12-23 (×3): qty 2

## 2010-12-23 MED ORDER — SODIUM CHLORIDE 0.9 % IJ SOLN
INTRAMUSCULAR | Status: AC
  Administered 2010-12-23: 10 mL
  Filled 2010-12-23: qty 10

## 2010-12-23 MED ORDER — SODIUM CHLORIDE 0.9 % IJ SOLN
INTRAMUSCULAR | Status: AC
  Administered 2010-12-23: 18:00:00
  Filled 2010-12-23: qty 10

## 2010-12-23 MED ORDER — SODIUM CHLORIDE 0.9 % IJ SOLN
INTRAMUSCULAR | Status: AC
  Administered 2010-12-23: 14:00:00
  Filled 2010-12-23: qty 10

## 2010-12-23 NOTE — Progress Notes (Cosign Needed)
Subjective: Miguel Elliott is a 35 y.o. male admitted with n/v, hematemesis. C/O 20 lb weight loss. EGD showed erosive RE, grade 1 EV, portal gastropathy, M-W tear. H/H stable. This morning patient c/o decreased urine output. C/O dysuria, suprapubic pain and low back pain (all new symptoms since yesterday). C/O vomiting blood last night which he states he reported to the nursing staff. Urine output recorded as 100cc plus 5 urine occurrences yesterday.  Nursing staff documented that he ate 2 complete trays for dinner last night. No documentation of emesis last night. Patient had me leave voicemail of his condition for his ex.  Objective: Vital signs in last 24 hours: Temp:  [97.5 F (36.4 C)-98.6 F (37 C)] 97.7 F (36.5 C) (09/21 0546) Pulse Rate:  [73-99] 82  (09/21 0546) Resp:  [13-20] 18  (09/21 0546) BP: (115-181)/(87-111) 158/98 mmHg (09/21 0546) SpO2:  [94 %-100 %] 96 % (09/21 0546) Weight:  [262 lb 2 oz (118.9 kg)] 262 lb 2 oz (118.9 kg) (09/21 2130) Last BM Date: 12/22/10 General:   Alert,  Well-developed, well-nourished, pleasant and cooperative in NAD Head:  Normocephalic and atraumatic. Eyes:  Sclera clear, no icterus.  Chest: CTA bilaterally without rales, rhonchi, crackles.    Heart:  Regular rate and rhythm; no murmurs, clicks, rubs,  or gallops. Abdomen:  Soft, nondistended. Grimace with palpation of epigastric and suprapubic area. Normal bowel sounds. No guarding, and without rebound.   Extremities:  Without clubbing, deformity or edema. Neurologic:  Alert and  oriented x4;  grossly normal neurologically. Skin:  Intact without significant lesions or rashes. Psych:  Alert and cooperative. Normal mood and affect.  Intake/Output from previous day: 09/20 0701 - 09/21 0700 In: 2666.5 [P.O.:900; I.V.:1752.5; IV Piggyback:14] Out: 100 [Urine:100] Intake/Output this shift:    Lab Results: CBC  Basename 12/23/10 0504 12/22/10 2324 12/22/10 1800 12/21/10 2350  WBC 5.9 --  -- 7.8  HGB 15.1 15.0 15.0 --  HCT 46.5 45.0 45.1 --  MCV 93.4 -- -- 91.0  PLT 228 -- -- 236   BMET  Basename 12/23/10 0504 12/21/10 2350  NA 141 139  K 4.0 3.6  CL 107 104  CO2 29 24  GLUCOSE 103* 103*  BUN 8 10  CREATININE 1.27 1.11  CALCIUM 8.5 9.4   LFTs  Basename 12/21/10 2350  BILITOT 0.5  BILIDIR --  IBILI --  ALKPHOS 81  AST 34  ALT 20  PROT 7.6  ALBUMIN 4.2    Basename 12/22/10 0940 12/21/10 2350  LIPASE 15 14   PT/INR  Basename 12/22/10 0526  LABPROT 14.3  INR 1.09     Assessment:  Trivial UGI bleed secondary to M-W tear. Erosive esophagitis. Diffuse gastric erosions. Two short columns of Grade 1 esophageal varices and evidence of portal gastropathy, ?underlying occult liver disease.   Patient reported poor oral intake (consumed two trays for dinner last night per nursing staff) and urinary output with dysuria/low-back pain/suprapubic pain. Reports ongoing hematemesis which is not documented by nursing staff.  Plan: #1 abd u/s today to evaluate for occult liver disease #2 evaluation for dysuria/suprapubic pain per attending  LOS: 2 days   Tana Coast  12/23/2010, 8:10 AM

## 2010-12-23 NOTE — Progress Notes (Signed)
Subjective:  denies nausea/vomiting, tolerating by mouth well. Complaining of lower abdominal discomfort and back pain.  Objective: Vital signs in last 24 hours: Filed Vitals:   12/23/10 0546 12/23/10 0642 12/23/10 1000 12/23/10 1400  BP: 158/98  163/91 177/107  Pulse: 82  77 79  Temp: 97.7 F (36.5 C)  98 F (36.7 C) 98.2 F (36.8 C)  TempSrc: Oral  Oral Oral  Resp: 18  18 18   Height:      Weight:  118.9 kg (262 lb 2 oz)    SpO2: 96%  95% 94%   Weight change: 4.934 kg (10 lb 14 oz)  Intake/Output Summary (Last 24 hours) at 12/23/10 1728 Last data filed at 12/23/10 1300  Gross per 24 hour  Intake 2592.5 ml  Output      0 ml  Net 2592.5 ml     General Appearance:    Alert, cooperative, no distress, appears stated age  Lungs:     Clear to auscultation bilaterally, respirations unlabored   Heart:    Regular rate and rhythm, S1 and S2 normal, no murmur, rub   or gallop  Abdomen:     Soft, bowel sounds active all four quadrants, mild lower abdominal tenderness, also mild tenderness in the left upper quadrant.no masses, no organomegaly  Extremities:   Extremities normal, atraumatic, no cyanosis or edema  Neurologic:   CNII-XII intact, normal strength   Lab Results: Results for orders placed during the hospital encounter of 12/21/10 (from the past 24 hour(s))  HEMOGLOBIN AND HEMATOCRIT, BLOOD     Status: Normal   Collection Time   12/22/10  6:00 PM      Component Value Range   Hemoglobin 15.0  13.0 - 17.0 (g/dL)   HCT 16.1  09.6 - 04.5 (%)  HEMOGLOBIN AND HEMATOCRIT, BLOOD     Status: Normal   Collection Time   12/22/10 11:24 PM      Component Value Range   Hemoglobin 15.0  13.0 - 17.0 (g/dL)   HCT 40.9  81.1 - 91.4 (%)  BASIC METABOLIC PANEL     Status: Abnormal   Collection Time   12/23/10  5:04 AM      Component Value Range   Sodium 141  135 - 145 (mEq/L)   Potassium 4.0  3.5 - 5.1 (mEq/L)   Chloride 107  96 - 112 (mEq/L)   CO2 29  19 - 32 (mEq/L)   Glucose, Bld 103  (*) 70 - 99 (mg/dL)   BUN 8  6 - 23 (mg/dL)   Creatinine, Ser 7.82  0.50 - 1.35 (mg/dL)   Calcium 8.5  8.4 - 95.6 (mg/dL)   GFR calc non Af Amer >60  >60 (mL/min)   GFR calc Af Amer >60  >60 (mL/min)  CBC     Status: Normal   Collection Time   12/23/10  5:04 AM      Component Value Range   WBC 5.9  4.0 - 10.5 (K/uL)   RBC 4.98  4.22 - 5.81 (MIL/uL)   Hemoglobin 15.1  13.0 - 17.0 (g/dL)   HCT 21.3  08.6 - 57.8 (%)   MCV 93.4  78.0 - 100.0 (fL)   MCH 30.3  26.0 - 34.0 (pg)   MCHC 32.5  30.0 - 36.0 (g/dL)   RDW 46.9  62.9 - 52.8 (%)   Platelets 228  150 - 400 (K/uL)  HEMOGLOBIN AND HEMATOCRIT, BLOOD     Status: Normal   Collection Time  12/23/10 11:43 AM      Component Value Range   Hemoglobin 15.5  13.0 - 17.0 (g/dL)   HCT 78.4  69.6 - 29.5 (%)    Micro Results: No results found for this or any previous visit (from the past 240 hour(s)). Studies/Results: Scheduled Meds:   . amLODipine  10 mg Oral Daily  . meperidine      . midazolam      . ondansetron  4 mg Oral Q6H   Or  . ondansetron (ZOFRAN) IV  4 mg Intravenous Q6H  . pantoprazole (PROTONIX) IV  40 mg Intravenous Q12H  . promethazine      . sodium chloride      . sodium chloride      . sodium chloride      . sodium chloride      . sucralfate  1 g Oral TID WC & HS   Continuous Infusions:   . 0.9 % NaCl with KCl 20 mEq / L 150 mL/hr at 12/23/10 1700   PRN Meds:.bisacodyl, HYDROmorphone, polyethylene glycol, promethazine, promethazine, sodium phosphate Assessment/Plan: Principal Problem:  *Hypertensive urgency- will start on Norvasc, and decrease IV fluids and follow. Active Problems:  Hematemesis- status post EGD revealing Mallory weis tear. Erosive esophagitis. Diffuse gastric erosions. Two short columns of Grade 1 esophageal varices and evidence of portal gastropathy. Abdominal ultrasound ordered per GI to further evaluate for possible occult liver disease that. We'll also obtain a lipase given  the left upper  quadrant tenderness noted on exam.  Headache - resolved, likely secondary to #1  Dehydration- improved with hydration, will decrease IV fluids at  Hematuria, microscopic- will empirically treat with antibiotics and given that is also having suprapubic pain. Follow and recheck UA outpatient and for the eval as appropriate after he completes a full course of antibiotics    LOS: 2 days   VIYUOH,ADELINE C 12/23/2010, 5:28 PM

## 2010-12-24 LAB — CBC
HCT: 44.9 % (ref 39.0–52.0)
Hemoglobin: 14.6 g/dL (ref 13.0–17.0)
MCH: 30 pg (ref 26.0–34.0)
MCHC: 32.5 g/dL (ref 30.0–36.0)
MCV: 92.2 fL (ref 78.0–100.0)
Platelets: 217 10*3/uL (ref 150–400)
RBC: 4.87 MIL/uL (ref 4.22–5.81)
RDW: 13.4 % (ref 11.5–15.5)
WBC: 6.1 10*3/uL (ref 4.0–10.5)

## 2010-12-24 LAB — BASIC METABOLIC PANEL
BUN: 7 mg/dL (ref 6–23)
CO2: 28 mEq/L (ref 19–32)
Calcium: 8.8 mg/dL (ref 8.4–10.5)
Chloride: 102 mEq/L (ref 96–112)
Creatinine, Ser: 1.2 mg/dL (ref 0.50–1.35)
GFR calc Af Amer: >60 mL/min (ref >60)
GFR calc non Af Amer: >60 mL/min (ref >60)
Glucose, Bld: 130 mg/dL — ABNORMAL HIGH (ref 70–99)
Potassium: 4.2 mEq/L (ref 3.5–5.1)
Sodium: 136 mEq/L (ref 135–145)

## 2010-12-24 LAB — HEMOGLOBIN AND HEMATOCRIT, BLOOD
HCT: 44.9 % (ref 39.0–52.0)
HCT: 47.8 % (ref 39.0–52.0)
Hemoglobin: 15 g/dL (ref 13.0–17.0)
Hemoglobin: 16.1 g/dL (ref 13.0–17.0)

## 2010-12-24 LAB — GLUCOSE, CAPILLARY: Glucose-Capillary: 120 mg/dL — ABNORMAL HIGH (ref 70–99)

## 2010-12-24 MED ORDER — CLONIDINE HCL 0.2 MG PO TABS
0.2000 mg | ORAL_TABLET | Freq: Every day | ORAL | Status: AC
  Administered 2010-12-24: 0.2 mg via ORAL
  Filled 2010-12-24: qty 1

## 2010-12-24 MED ORDER — LABETALOL HCL 5 MG/ML IV SOLN
10.0000 mg | INTRAVENOUS | Status: DC | PRN
  Administered 2010-12-24: 10 mg via INTRAVENOUS
  Filled 2010-12-24 (×2): qty 4

## 2010-12-24 MED ORDER — SODIUM CHLORIDE 0.9 % IJ SOLN
INTRAMUSCULAR | Status: AC
  Administered 2010-12-24: 09:00:00
  Filled 2010-12-24: qty 10

## 2010-12-24 MED ORDER — HYDROCHLOROTHIAZIDE 25 MG PO TABS
25.0000 mg | ORAL_TABLET | Freq: Every day | ORAL | Status: DC
  Administered 2010-12-24 – 2010-12-26 (×3): 25 mg via ORAL
  Filled 2010-12-24 (×3): qty 1

## 2010-12-24 NOTE — Progress Notes (Signed)
Subjective: Patient states he had some nausea or vomiting last p.m., but tolerating by mouth's well today. Objective: Vital signs in last 24 hours: Filed Vitals:   12/23/10 1400 12/23/10 2200 12/24/10 0200 12/24/10 0600  BP: 177/107 178/99 159/89 164/91  Pulse: 79 89 68 71  Temp: 98.2 F (36.8 C) 98.6 F (37 C) 97.6 F (36.4 C) 97.9 F (36.6 C)  TempSrc: Oral Oral Oral Oral  Resp: 18 16 20 20   Height:      Weight:    117.2 kg (258 lb 6.1 oz)  SpO2: 94% 98% 97% 96%   Weight change: -1.7 kg (-3 lb 12 oz)  Intake/Output Summary (Last 24 hours) at 12/24/10 1941 Last data filed at 12/24/10 1743  Gross per 24 hour  Intake    920 ml  Output      0 ml  Net    920 ml   General Appearance:    Alert, cooperative, no distress, appears stated age  Lungs:     Clear to auscultation bilaterally, respirations unlabored   Heart:    Regular rate and rhythm, S1 and S2 normal, no murmur, rub   or gallop  Abdomen:     Soft, lower abdominal/suprapubic tenderness, bowel sounds active all four quadrants,    no masses, no organomegaly  Extremities:   Extremities normal, atraumatic, no cyanosis or edema   Lab Results:  Micro Results: No results found for this or any previous visit (from the past 240 hour(s)). Studies/Results: US Abdomen Complete  12/23/2010  *RADIOLOGY REPORT*  Clinical Data:  Evaluate for occult liver disease.  New diagnosis of portal gastropathy and esophageal varices.  COMPLETE ABDOMINAL ULTRASOUND  Comparison:  None.  Findings:  Gallbladder:  Comet-tail artifact within the gallbladder wall, consistent with adenomyomatosis.  No stone, wall thickening, or pericholecystic fluid. Sonographic Murphy's sign was not elicited.  Common bile duct: Normal, at 2 mm.  Liver: Normal in appearance.  No evidence of cirrhosis or portal venous hypertension.  IVC: Negative  Pancreas:  Poorly visualized due to overlying bowel gas.  Spleen:  Normal in size and echogenicity.  Right Kidney:  11.3 cm.  No hydronephrosis.  Left Kidney:  11.6 cm. No hydronephrosis.  Abdominal aorta:  Nonaneurysmal without ascites.  IMPRESSION:  1.  Normal appearance of the liver.  No evidence of cirrhosis or explanation for varices. 2.  Gallbladder adenomyomatosis.  Original Report Authenticated By: Consuello Bossier, M.D.    Scheduled Meds:   . amLODipine  10 mg Oral Daily  . cefTRIAXone (ROCEPHIN) IV  1 g Intravenous Q24H  . cloNIDine  0.2 mg Oral Daily  . hydrochlorothiazide  25 mg Oral Daily  . ondansetron  4 mg Oral Q6H   Or  . ondansetron (ZOFRAN) IV  4 mg Intravenous Q6H  . pantoprazole (PROTONIX) IV  40 mg Intravenous Q12H  . sodium chloride      . sucralfate  1 g Oral TID WC & HS   Continuous Infusions:   . 0.9 % NaCl with KCl 20 mEq / L 10 mL/hr (12/24/10 0034)   Assessment/Plan: Principal Problem:  *Hypertensive urgency- blood pressure still poorly controlled on Norvasc, will add hydrochlorothiazide, and IV labetalol when necessary, and followup.  Active Problems:   Mallory weiss tear/ Erosive esophagitis/Diffuse gastric erosions - continue PPI, hemoglobin stable .also Two short columns of Grade 1 esophageal varices and evidence of portal gastropathy and abdominal ultrasound ordered per GI and the results are stated above-with abnormal appearance of the  liver and, no evidence of cirrhosis or explanation for varices, Gallbladder adenomyomatosis- I discussed patient with Dr. Jena Gauss and he states given the patient's hemoglobin is stable that from his standpoint he stable for discharge and that he should followup outpatient for results of his biopsies and followup on the ultrasound with him as well when he is discharged   Dehydration- resolved with hydration, IV fluids discontinued.   Hematuria/probable urinary tract- continue empiric antibiotics , follow up urine culture. Follow and recheck UA outpatient and for the eval as appropriate after he completes a full course of antibiotics.  Plan is for  patient to be discharged  once his blood pressure control is improved.     LOS: 3 days   VIYUOH,ADELINE C 12/24/2010, 7:41 PM

## 2010-12-25 LAB — GLUCOSE, CAPILLARY: Glucose-Capillary: 134 mg/dL — ABNORMAL HIGH (ref 70–99)

## 2010-12-25 LAB — HEMOGLOBIN AND HEMATOCRIT, BLOOD
HCT: 47.8 % (ref 39.0–52.0)
Hemoglobin: 16 g/dL (ref 13.0–17.0)

## 2010-12-25 MED ORDER — OMEPRAZOLE MAGNESIUM 20 MG PO TBEC: 20.0000 mg | DELAYED_RELEASE_TABLET | Freq: Every day | ORAL | Status: DC

## 2010-12-25 MED ORDER — HYDROCODONE-ACETAMINOPHEN 5-325 MG PO TABS
2.0000 | ORAL_TABLET | Freq: Four times a day (QID) | ORAL | Status: DC | PRN
  Administered 2010-12-25 – 2010-12-26 (×2): 2 via ORAL
  Filled 2010-12-25 (×2): qty 2

## 2010-12-25 MED ORDER — AMLODIPINE BESYLATE 10 MG PO TABS: 10.0000 mg | ORAL_TABLET | Freq: Every day | ORAL | Status: DC

## 2010-12-25 MED ORDER — HYDROCHLOROTHIAZIDE 25 MG PO TABS: 25.0000 mg | ORAL_TABLET | Freq: Every day | ORAL | Status: DC

## 2010-12-25 MED ORDER — PANTOPRAZOLE SODIUM 40 MG PO TBEC
40.0000 mg | DELAYED_RELEASE_TABLET | Freq: Two times a day (BID) | ORAL | Status: DC
  Administered 2010-12-26: 40 mg via ORAL
  Filled 2010-12-25: qty 1

## 2010-12-25 MED ORDER — CLONAZEPAM 0.5 MG PO TABS
0.5000 mg | ORAL_TABLET | Freq: Two times a day (BID) | ORAL | Status: DC
Start: 2010-12-25 — End: 2010-12-26
  Administered 2010-12-25 – 2010-12-26 (×3): 0.5 mg via ORAL
  Filled 2010-12-25 (×3): qty 1

## 2010-12-25 MED ORDER — ACETAMINOPHEN 325 MG PO TABS
650.0000 mg | ORAL_TABLET | ORAL | Status: DC | PRN
  Filled 2010-12-25: qty 2

## 2010-12-25 NOTE — Progress Notes (Signed)
Subjective: Very stressed about his children's situation. Reports homicidal thoughts but he understands that if he kills his fiance new boyfriend - he will go to prison forever. Reports prior problems with anger management as well as posttraumatic stress disorder and panic attacks. Feels that his abdomen is still tender in that he is still nauseous.  Objective: Vital signs in last 24 hours: Temp:  [97 F (36.1 C)-98.6 F (37 C)] 97 F (36.1 C) (09/23 0437) Pulse Rate:  [70-110] 70  (09/23 0437) Resp:  [18] 18  (09/23 0437) BP: (130-166)/(77-104) 150/93 mmHg (09/23 1100) SpO2:  [91 %-94 %] 91 % (09/23 0437) Weight:  [116.892 kg (257 lb 11.2 oz)] 257 lb 11.2 oz (116.892 kg) (09/23 0437) Weight change: -0.308 kg (-10.9 oz) Last BM Date: 12/22/10  Intake/Output from previous day: 09/22 0701 - 09/23 0700 In: 800 [P.O.:800] Out: -  Intake/Output this shift:    General appearance: cooperative GI: soft, non-tender; bowel sounds normal; no masses,  no organomegaly Skin: Skin color, texture, turgor normal. No rashes or lesions Does not have any hallucinations. Denies suicidal ideations Lab Results:  Basename 12/25/10 0547 12/24/10 2154 12/24/10 0215 12/23/10 0504  WBC -- -- 6.1 5.9  HGB 16.0 16.1 -- --  HCT 47.8 47.8 -- --  PLT -- -- 217 228   BMET  Basename 12/24/10 0215 12/23/10 0504  NA 136 141  K 4.2 4.0  CL 102 107  CO2 28 29  GLUCOSE 130* 103*  BUN 7 8  CREATININE 1.20 1.27  CALCIUM 8.8 8.5    Studies/Results: US Abdomen Complete  12/23/2010  *RADIOLOGY REPORT*  Clinical Data:  Evaluate for occult liver disease.  New diagnosis of portal gastropathy and esophageal varices.  COMPLETE ABDOMINAL ULTRASOUND  Comparison:  None.  Findings:  Gallbladder:  Comet-tail artifact within the gallbladder wall, consistent with adenomyomatosis.  No stone, wall thickening, or pericholecystic fluid. Sonographic Murphy's sign was not elicited.  Common bile duct: Normal, at 2 mm.  Liver:  Normal in appearance.  No evidence of cirrhosis or portal venous hypertension.  IVC: Negative  Pancreas:  Poorly visualized due to overlying bowel gas.  Spleen:  Normal in size and echogenicity.  Right Kidney:  11.3 cm. No hydronephrosis.  Left Kidney:  11.6 cm. No hydronephrosis.  Abdominal aorta:  Nonaneurysmal without ascites.  IMPRESSION:  1.  Normal appearance of the liver.  No evidence of cirrhosis or explanation for varices. 2.  Gallbladder adenomyomatosis.  Original Report Authenticated By: Consuello Bossier, M.D.    Medications: I have reviewed the patient's current medications.  Assessment/plan #1 hematemesis due to Mallory-Weiss tear-resolved continue Carafate and PPI. Will have PPI at the time of discharge prescribed to the patient. #2 hypertension better controlled-continue current medications #3 social stressors PTSD panic disorder-start clonidine-obtain psychiatric consult Home tomorrow  LOS: 4 days   LAZA,SORIN 12/25/2010, 1:46 PM

## 2010-12-26 ENCOUNTER — Encounter: Payer: Self-pay | Admitting: Urgent Care

## 2010-12-26 MED ORDER — HYDROCODONE-ACETAMINOPHEN 5-325 MG PO TABS: 1.0000 | ORAL_TABLET | ORAL | Status: AC | PRN

## 2010-12-26 MED ORDER — CLONAZEPAM 0.5 MG PO TABS: 0.5000 mg | ORAL_TABLET | Freq: Two times a day (BID) | ORAL | Status: DC

## 2010-12-26 NOTE — Discharge Summary (Signed)
Patient ID: Miguel Elliott MRN: [id number] DOB/AGE: May 16, 1975 35 y.o. Primary Care Physician:No primary provider on file. Admit date: 12/21/2010 Discharge date: 12/26/2010    Discharge Diagnoses:  #1 hypertensive urgency #2 upper gastrointestinal bleeding due to Mallory-Weiss tear #3 posttraumatic stress disorder and severe anxiety #4 severe social stressors #5 dehydration resolved   Current Discharge Medication List    START taking these medications   Details  amLODipine (NORVASC) 10 MG tablet Take 1 tablet (10 mg total) by mouth daily. Qty: 30 tablet, Refills: 0    clonazePAM (KLONOPIN) 0.5 MG tablet Take 1 tablet (0.5 mg total) by mouth 2 (two) times daily. Qty: 60 tablet, Refills: 0    hydrochlorothiazide (HYDRODIURIL) 25 MG tablet Take 1 tablet (25 mg total) by mouth daily. Qty: 30 tablet, Refills: 0    HYDROcodone-acetaminophen (NORCO) 5-325 MG per tablet Take 1 tablet by mouth every 4 (four) hours as needed for pain. Qty: 30 tablet, Refills: 0    omeprazole (PRILOSEC OTC) 20 MG tablet Take 1 tablet (20 mg total) by mouth daily. Qty: 30 tablet, Refills: 0        Discharged Condition: Discharge in good condition alert and oriented no acute distress    Consults: Dr. Jena Gauss with gastroenterology  Significant Diagnostic Studies: X-ray Chest Pa And Lateral   12/22/2010  *RADIOLOGY REPORT*  Clinical Data: Hemoptysis, hypertensive urgency.  CHEST - 2 VIEW  Comparison: None.  Findings: Eventration right hemidiaphragm.  No focal consolidation, pleural effusion, or pneumothorax.  Cardiomediastinal contours are within the upper normal limits.  No acute osseous abnormality.  IMPRESSION: No acute process identified.  Original Report Authenticated By: Waneta Martins, M.D.   US Abdomen Complete  12/23/2010  *RADIOLOGY REPORT*  Clinical Data:  Evaluate for occult liver disease.  New diagnosis of portal gastropathy and esophageal varices.  COMPLETE ABDOMINAL ULTRASOUND   Comparison:  None.  Findings:  Gallbladder:  Comet-tail artifact within the gallbladder wall, consistent with adenomyomatosis.  No stone, wall thickening, or pericholecystic fluid. Sonographic Murphy's sign was not elicited.  Common bile duct: Normal, at 2 mm.  Liver: Normal in appearance.  No evidence of cirrhosis or portal venous hypertension.  IVC: Negative  Pancreas:  Poorly visualized due to overlying bowel gas.  Spleen:  Normal in size and echogenicity.  Right Kidney:  11.3 cm. No hydronephrosis.  Left Kidney:  11.6 cm. No hydronephrosis.  Abdominal aorta:  Nonaneurysmal without ascites.  IMPRESSION:  1.  Normal appearance of the liver.  No evidence of cirrhosis or explanation for varices. 2.  Gallbladder adenomyomatosis.  Original Report Authenticated By: Consuello Bossier, M.D.    Lab Results: Results for orders placed during the hospital encounter of 12/21/10 (from the past 48 hour(s))  GLUCOSE, CAPILLARY     Status: Abnormal   Collection Time   12/24/10  5:56 PM      Component Value Range Comment   Glucose-Capillary 120 (*) 70 - 99 (mg/dL)   HEMOGLOBIN AND HEMATOCRIT, BLOOD     Status: Normal   Collection Time   12/24/10  9:54 PM      Component Value Range Comment   Hemoglobin 16.1  13.0 - 17.0 (g/dL)    HCT 78.2  95.6 - 21.3 (%)   HEMOGLOBIN AND HEMATOCRIT, BLOOD     Status: Normal   Collection Time   12/25/10  5:47 AM      Component Value Range Comment   Hemoglobin 16.0  13.0 - 17.0 (g/dL)    HCT  47.8  39.0 - 52.0 (%)   GLUCOSE, CAPILLARY     Status: Abnormal   Collection Time   12/25/10  9:01 PM      Component Value Range Comment   Glucose-Capillary 134 (*) 70 - 99 (mg/dL)    No results found for this or any previous visit (from the past 240 hour(s)).   Hospital Course:  #1 severe hypertension - probably in the setting social stressors, untreated hypertension, PTSD, the patient was admitted to the hospital, he was started on amlodipine, HCTZ and as needed labetalol intravenously.  By the time of discharge the blood pressure is better controlled 131/84. #2 upper gastrointestinal bleeding - probably was in the setting of Mallory-Weiss tear from excessive retching. The patient remained hemodynamically stable. He was treated with intravenous Protonix and Carafate during hospitalization. Upper endoscopy did not reveal any ulcer, gastritis, or significant esophageal varices. The report is for mild esophageal varices the patient does not have any clinical or radiographic signs of cirrhosis. #3 -[name] brought to attention his significant social stressors and PTSD. He denied any suicidal ideation. He has significant anger towards his girlfriend's new boyfriend. He will he was evaluated by the ACT team, and the patient was provided with phone numbers for outpatient counseling. The patient was started on Klonopin twice a day and strongly encouraged to followup with therapy.  Discharge Exam: Blood pressure 131/84, pulse 97, temperature 98.2 F (36.8 C), temperature source Oral, resp. rate 18, height 5\' 9"  (1.753 m), weight 116.348 kg (256 lb 8 oz), SpO2 95.00%.   Disposition: Home  Discharge Orders    Future Orders Please Complete By Expires   Diet - low sodium heart healthy      Diet - low sodium heart healthy      Increase activity slowly      Increase activity slowly         Follow-up Information    Follow up with [facility]. Call in 1 week.   Contact information:   [address] [place] [address] [phone]       Follow up with Eula Listen, MD in 2 weeks. (We will call to schedule appt)    Contact information:   [address] [place] [address] [phone]       Follow up with [facility] in 1 week.   Contact information:   [address] [place] [address] [phone]          Signed: Lonia Blood 12/26/2010, 1:38 PM

## 2010-12-26 NOTE — Progress Notes (Signed)
Patient discharged home. Patient verbalizes understanding of discharge instructions, follow up appointments and prescriptions. Patient escorted out by staff, transported by family.

## 2010-12-27 ENCOUNTER — Encounter (HOSPITAL_COMMUNITY): Payer: Self-pay | Admitting: Internal Medicine

## 2011-01-10 ENCOUNTER — Inpatient Hospital Stay: Payer: Self-pay | Admitting: Urgent Care

## 2011-01-12 ENCOUNTER — Inpatient Hospital Stay: Payer: Self-pay | Admitting: Urgent Care

## 2011-01-16 ENCOUNTER — Inpatient Hospital Stay: Payer: Self-pay | Admitting: Gastroenterology

## 2011-01-17 ENCOUNTER — Ambulatory Visit (INDEPENDENT_AMBULATORY_CARE_PROVIDER_SITE_OTHER): Payer: Self-pay | Admitting: Gastroenterology

## 2011-01-17 ENCOUNTER — Ambulatory Visit (HOSPITAL_COMMUNITY)
Admission: RE | Admit: 2011-01-17 | Discharge: 2011-01-17 | Disposition: A | Discharge status: Home / Self Care | Payer: Self-pay | Source: Ambulatory Visit | Attending: Gastroenterology | Admitting: Gastroenterology

## 2011-01-17 ENCOUNTER — Telehealth: Payer: Self-pay | Admitting: Gastroenterology

## 2011-01-17 ENCOUNTER — Encounter: Payer: Self-pay | Admitting: Gastroenterology

## 2011-01-17 DIAGNOSIS — R109 Unspecified abdominal pain: Secondary | ICD-10-CM | POA: Insufficient documentation

## 2011-01-17 DIAGNOSIS — R634 Abnormal weight loss: Secondary | ICD-10-CM

## 2011-01-17 DIAGNOSIS — R1110 Vomiting, unspecified: Secondary | ICD-10-CM

## 2011-01-17 DIAGNOSIS — K920 Hematemesis: Secondary | ICD-10-CM

## 2011-01-17 DIAGNOSIS — D1803 Hemangioma of intra-abdominal structures: Secondary | ICD-10-CM | POA: Insufficient documentation

## 2011-01-17 DIAGNOSIS — R748 Abnormal levels of other serum enzymes: Secondary | ICD-10-CM | POA: Insufficient documentation

## 2011-01-17 MED ORDER — DEXLANSOPRAZOLE 60 MG PO CPDR: 60.0000 mg | DELAYED_RELEASE_CAPSULE | Freq: Every day | ORAL | Status: AC

## 2011-01-17 MED ORDER — IOHEXOL 300 MG/ML  SOLN
100.0000 mL | Freq: Once | INTRAMUSCULAR | Status: AC | PRN
  Administered 2011-01-17: 100 mL via INTRAVENOUS

## 2011-01-17 NOTE — Patient Instructions (Addendum)
Take Dexilant 60 mg, one capsule 30 mins before breakfast daily. This takes place of Prilosec OTC, but when you run out of samples of Dexilant go back on Prilosec OTC.  Get your lab work done right when you leave our office.  We have ordered a CT scan of your abdomen.  You need to make appointment to be seen at [facility] to establish care. We cannot provide you with medications that are not for GI condition.   We will request copy of your records from [facility].  Please eat a bland diet, nothing fried or fatty.

## 2011-01-17 NOTE — Telephone Encounter (Signed)
Pt referred to [facility]- he has appt 10/17 @ 1:45 w/ Thurmond Butts- notes faxed

## 2011-01-17 NOTE — Progress Notes (Signed)
Primary Care Physician:  None  Primary Gastroenterologist:  Roetta Sessions, MD  Chief Complaint  Patient presents with  . Follow-up  . Nausea  . Emesis    HPI: Miguel Elliott is a 35 y.o. male here for followup of recent hospitalization. Hospitalized at [facility] for hematemesis in 12/2010. States two days after D/C, vomited some more blood and went to [facility] and had another EGD. He states he continues to have intermittent vomiting. The last time was last night. He is having difficulty with his appetite. Trying Ensure/biscuit and gravy. Some mid-abdominal pain with meals. No heartburn. BM not often with decreased oral intake. Dark stools. No rectal bleeding. Still smoking pot. No other drugs. No nsaids/asa. Clonazepam helping with anger issues but still lot of stress issues. He did not establish care with a primary care physician or with mental health.   Current Outpatient Prescriptions  Medication Sig Dispense Refill  . amLODipine (NORVASC) 10 MG tablet Take 1 tablet (10 mg total) by mouth daily.  30 tablet  0  . clonazePAM (KLONOPIN) 0.5 MG tablet Take 1 tablet (0.5 mg total) by mouth 2 (two) times daily.  60 tablet  0  . hydrochlorothiazide (HYDRODIURIL) 25 MG tablet Take 1 tablet (25 mg total) by mouth daily.  30 tablet  0  . omeprazole (PRILOSEC OTC) 20 MG tablet Take 1 tablet (20 mg total) by mouth daily.  30 tablet  0    Allergies as of 01/17/2011  . (No Known Allergies)   Past Medical History  Diagnosis Date  . Hypertension   . GERD (gastroesophageal reflux disease)   . Depression     no medication  . PTSD (post-traumatic stress disorder)   . Anxiety   . Abnormal weight loss     30 pounds since 11/2010  . Esophageal varices   . Portal hypertensive gastropathy    Past Surgical History  Procedure Date  . Foot surgery   . Nasal reconstruction 2003  . Esophagogastroduodenoscopy 12/22/2010    Procedure: ESOPHAGOGASTRODUODENOSCOPY (EGD);  Surgeon: Corbin Ade, MD;  Location: [facility]  END[facility] SUITE;  Service: Endoscopy;  Laterality: N/A;.    ERE, M-W tear, gastric erosions, bx benign, minimal esophageal variecs, portal gastropathy    ROS:  General: Negative for fever, chills, fatigue, weakness. See HPI. ENT: Negative for hoarseness, difficulty swallowing , nasal congestion. CV: Negative for chest pain, angina, palpitations, dyspnea on exertion, peripheral edema.  Respiratory: Negative for dyspnea at rest, dyspnea on exertion, cough, sputum, wheezing.  GI: See history of present illness. GU:  Negative for dysuria, hematuria, urinary incontinence, urinary frequency, nocturnal urination.  Endo: see hpi.   Physical Examination:   BP 121/86  Pulse 100  Temp(Src) 97.2 F (36.2 C) (Temporal)  Ht 5\' 9"  (1.753 m)  Wt 238 lb 3.2 oz (108.047 kg)  BMI 35.18 kg/m2  General: Well-nourished, well-developed in no acute distress. Drowsy during visit. Eyes: No icterus. Mouth: Oropharyngeal mucosa moist and pink , no lesions erythema or exudate. Lungs: Clear to auscultation bilaterally.  Heart: Regular rate and rhythm, no murmurs rubs or gallops.  Abdomen: Bowel sounds are normal, diffuse tenderness, nondistended, no hepatosplenomegaly or masses, no abdominal bruits or hernia , no rebound or guarding.   Extremities: No lower extremity edema. No clubbing or deformities. Neuro: Alert and oriented x 4   Skin: Warm and dry, no jaundice.   Psych: Alert and cooperative, normal mood and affect.  Labs:  Lab Results  Component Value Date   WBC  6.1 12/24/2010   HGB 16.0 12/25/2010   HCT 47.8 12/25/2010   MCV 92.2 12/24/2010   PLT 217 12/24/2010   Lab Results  Component Value Date   CREATININE 1.20 12/24/2010   BUN 7 12/24/2010   NA 136 12/24/2010   K 4.2 12/24/2010   CL 102 12/24/2010   CO2 28 12/24/2010   Lab Results  Component Value Date   ALT 20 12/21/2010   AST 34 12/21/2010   ALKPHOS 81 12/21/2010   BILITOT 0.5 12/21/2010   Lab Results  Component Value Date   INR 1.09 12/22/2010    Lab Results  Component Value Date   LIPASE 15 12/22/2010    Imaging Studies:    US Abdomen Complete  2011-01-11  *RADIOLOGY REPORT*  Clinical Data:  Evaluate for occult liver disease.  New diagnosis of portal gastropathy and esophageal varices.  COMPLETE ABDOMINAL ULTRASOUND  Comparison:  None.  Findings:  Gallbladder:  Comet-tail artifact within the gallbladder wall, consistent with adenomyomatosis.  No stone, wall thickening, or pericholecystic fluid. Sonographic Murphy's sign was not elicited.  Common bile duct: Normal, at 2 mm.  Liver: Normal in appearance.  No evidence of cirrhosis or portal venous hypertension.  IVC: Negative  Pancreas:  Poorly visualized due to overlying bowel gas.  Spleen:  Normal in size and echogenicity.  Right Kidney:  11.3 cm. No hydronephrosis.  Left Kidney:  11.6 cm. No hydronephrosis.  Abdominal aorta:  Nonaneurysmal without ascites.  IMPRESSION:  1.  Normal appearance of the liver.  No evidence of cirrhosis or explanation for varices. 2.  Gallbladder adenomyomatosis.  Original Report Authenticated By: Consuello Bossier, M.D.

## 2011-01-17 NOTE — Progress Notes (Signed)
Quick Note:  Nothing to explain weight loss or abd pain.  Probable hemangioma. CT liver with /without contrast in 6 months to f/u liver lesion. Await labs. ______

## 2011-01-17 NOTE — Assessment & Plan Note (Signed)
Ongoing abdominal pain associated with vomiting, hematemesis, weight loss, anorexia. EGD findings as outlined above. He had portal hypertensive gastropathy, small esophageal varices. Abd u/s showed no evidence of chronic liver disease. Thought to have trivial ugi bleed due to M-W tear. H/H stable during hospitalization. Since been to [facility]. We have requested records.   Due to ongoing abd pain, ?h/o liver disease, weight loss--> CT A/P with contrast today. Recheck labs. Check fasting am cortisol level as well.   See epic labs for previous labs including normal TSH.   Start Dexilant 60mg  daily in place of prilosec otc. Samples provided.   We are assisting in making him appt with [facility] to establish care. He was advised that we would not be providing him with non-GI prescriptions. Further recommendations to follow.

## 2011-01-18 LAB — COMPREHENSIVE METABOLIC PANEL WITH GFR
ALT: 22 U/L (ref 0–53)
AST: 17 U/L (ref 0–37)
Albumin: 4.2 g/dL (ref 3.5–5.2)
Alkaline Phosphatase: 74 U/L (ref 39–117)
BUN: 11 mg/dL (ref 6–23)
CO2: 26 meq/L (ref 19–32)
Calcium: 9.5 mg/dL (ref 8.4–10.5)
Chloride: 100 meq/L (ref 96–112)
Creat: 1.11 mg/dL (ref 0.50–1.35)
Glucose, Bld: 115 mg/dL — ABNORMAL HIGH (ref 70–99)
Potassium: 3.6 meq/L (ref 3.5–5.3)
Sodium: 140 meq/L (ref 135–145)
Total Bilirubin: 0.3 mg/dL (ref 0.3–1.2)
Total Protein: 7.1 g/dL (ref 6.0–8.3)

## 2011-01-18 LAB — CBC WITH DIFFERENTIAL/PLATELET
Basophils Absolute: 0 K/uL (ref 0.0–0.1)
Basophils Relative: 0 % (ref 0–1)
Eosinophils Absolute: 0.1 K/uL (ref 0.0–0.7)
Eosinophils Relative: 2 % (ref 0–5)
HCT: 49 % (ref 39.0–52.0)
Hemoglobin: 16.5 g/dL (ref 13.0–17.0)
Lymphocytes Relative: 31 % (ref 12–46)
Lymphs Abs: 1.4 K/uL (ref 0.7–4.0)
MCH: 30.9 pg (ref 26.0–34.0)
MCHC: 33.7 g/dL (ref 30.0–36.0)
MCV: 91.8 fL (ref 78.0–100.0)
Monocytes Absolute: 0.5 K/uL (ref 0.1–1.0)
Monocytes Relative: 11 % (ref 3–12)
Neutro Abs: 2.6 K/uL (ref 1.7–7.7)
Neutrophils Relative %: 57 % (ref 43–77)
Platelets: 269 K/uL (ref 150–400)
RBC: 5.34 MIL/uL (ref 4.22–5.81)
RDW: 13.7 % (ref 11.5–15.5)
WBC: 4.6 K/uL (ref 4.0–10.5)

## 2011-01-18 NOTE — Progress Notes (Signed)
No PCP on file 

## 2011-01-19 LAB — CORTISOL-AM, BLOOD: Cortisol - AM: 17.4 ug/dL (ref 4.3–22.4)

## 2011-01-21 NOTE — Progress Notes (Signed)
Quick Note:  All labs look good except glucose up a little.  Recommend he f/u with PCP regarding elevated glucose. Recommend HIDA with fatty meal to complete gb w/u.  Continue Dexilant. ______

## 2011-01-23 ENCOUNTER — Other Ambulatory Visit: Payer: Self-pay | Admitting: Internal Medicine

## 2011-01-23 DIAGNOSIS — R112 Nausea with vomiting, unspecified: Secondary | ICD-10-CM

## 2011-01-23 DIAGNOSIS — R1011 Right upper quadrant pain: Secondary | ICD-10-CM

## 2011-01-23 NOTE — Progress Notes (Signed)
HIDA scheduled for 01/26/11- he is aware

## 2011-01-26 ENCOUNTER — Ambulatory Visit (HOSPITAL_COMMUNITY)
Admission: RE | Admit: 2011-01-26 | Discharge: 2011-01-26 | Disposition: A | Discharge status: Home / Self Care | Payer: Self-pay | Source: Ambulatory Visit | Attending: Internal Medicine | Admitting: Internal Medicine

## 2011-01-26 ENCOUNTER — Encounter (HOSPITAL_COMMUNITY): Payer: Self-pay

## 2011-01-26 DIAGNOSIS — R1011 Right upper quadrant pain: Secondary | ICD-10-CM | POA: Insufficient documentation

## 2011-01-26 DIAGNOSIS — R112 Nausea with vomiting, unspecified: Secondary | ICD-10-CM | POA: Insufficient documentation

## 2011-01-26 MED ORDER — TECHNETIUM TC 99M MEBROFENIN IV KIT
5.0000 | PACK | Freq: Once | INTRAVENOUS | Status: AC | PRN
  Administered 2011-01-26: 4.5 via INTRAVENOUS

## 2011-01-26 MED ORDER — SINCALIDE 5 MCG IJ SOLR
INTRAMUSCULAR | Status: AC
  Administered 2011-01-26: 2.14 ug
  Filled 2011-01-26: qty 5

## 2011-01-26 MED ORDER — SINCALIDE 5 MCG IJ SOLR: 0.0200 ug/kg | Freq: Once | INTRAMUSCULAR | Status: DC

## 2011-01-30 ENCOUNTER — Telehealth: Payer: Self-pay | Admitting: Gastroenterology

## 2011-01-30 NOTE — Telephone Encounter (Signed)
Pt called wanting his HIDA scan results that were done last Thursday

## 2011-01-30 NOTE — Telephone Encounter (Signed)
Routed to LSL 

## 2011-01-31 NOTE — Progress Notes (Signed)
Received records from [facility]. Patient admitted overnight on 12/29/2010. EGD performed showed no evidence of varices in the entire esophagus, scattered mild inflammation characterized by erythema and granularity found in the gastric body. He also had an abdominal pelvic CT with contrast which he failed to disclose to me at our office visit. CT done at [facility] was unremarkable except for 2 small liver lesions of indeterminate significance. During hospitalization patient had normal lipase, creatinine, LFTs, CBC.

## 2011-01-31 NOTE — Telephone Encounter (Signed)
See HIDA report.

## 2011-01-31 NOTE — Progress Notes (Signed)
Quick Note:  Gallbladder EF was normal. Patient has some increased abd pain with CCK but not enough information to recommend he have his gb taken out. He has had extensive evaluation.  Recommend ASAP OV with RMR as next step. ______

## 2011-02-02 ENCOUNTER — Ambulatory Visit (INDEPENDENT_AMBULATORY_CARE_PROVIDER_SITE_OTHER): Payer: Self-pay | Admitting: Internal Medicine

## 2011-02-02 DIAGNOSIS — R109 Unspecified abdominal pain: Secondary | ICD-10-CM

## 2011-02-02 DIAGNOSIS — R110 Nausea: Secondary | ICD-10-CM

## 2011-02-02 DIAGNOSIS — R197 Diarrhea, unspecified: Secondary | ICD-10-CM

## 2011-02-02 DIAGNOSIS — R1110 Vomiting, unspecified: Secondary | ICD-10-CM

## 2011-02-02 DIAGNOSIS — R634 Abnormal weight loss: Secondary | ICD-10-CM

## 2011-02-02 NOTE — Progress Notes (Signed)
Primary Care Physician:  Reynolds Bowl, MD Primary Gastroenterologist:  Dr.   Pre-Procedure History & Physical: HPI:  Miguel Elliott is a 35 y.o. male here for steady, progressive weight loss of approximately 50 pounds since March of this year. Patient reports repeated episodes of nausea vomiting unable to keep much down. He has vague diffuse abdominal pain. Reports night sweats. Reports diarrhea upwards of 3 times daily only intermittently has a solid stool. In addition, patient states stool sometime sometimes are extremely foul smelling and have a greasy bubbly appearance in the toilet water.  His blood sugars sugars have been repeatedly elevated see multiple elevations in the medical record procedures all below 200) 2 severe he's had extensive laboratory studies including cortisol TSH comprehensive metabolic profile, CBC. He's had an EEG here and then at [facility] worries that 4 days in the hospital. Apparently, no etiology for his symptoms down there. I felt he had minimal esophageal varices on followup EGD did not see any evidence of varices reportedly. Ultrasound and CT negative for hepatobiliary abnormality aside from a hemangioma. Hi weighs gallbladder EF of 95% recently documented here.  The patient showed me a cell phone photo of  himself taken a few months ago when he reportedly weighed 30 more pounds than he does now. There does appear to be a big difference in his appearance.  He cannot tell one way or the other if dexilatn has helped..  Past Medical History  Diagnosis Date  . Hypertension   . GERD (gastroesophageal reflux disease)   . Depression     no medication  . PTSD (post-traumatic stress disorder)   . Anxiety   . Abnormal weight loss     30 pounds since 11/2010  . Esophageal varices 12/22/10     grade 1 egd by Dr. Jena Gauss  . Portal hypertensive gastropathy   . Mallory - Weiss tear 12/22/10    egd by Dr. Jena Gauss  . Hiatal hernia 12/22/10     egd by Dr. Jena Gauss  . Gastric  erosions 12/22/10     egd by Dr. Jena Gauss    Past Surgical History  Procedure Date  . Foot surgery   . Nasal reconstruction 2003  . Esophagogastroduodenoscopy 12/22/2010    Procedure: ESOPHAGOGASTRODUODENOSCOPY (EGD);  Surgeon: Corbin Ade, MD;  Location[facility];  Service: Endoscopy;  Laterality: N/A;.    ERE, M-W tear, gastric erosions, bx benign, minimal esophageal variecs, portal gastropathy    Prior to Admission medications   Medication Sig Start Date End Date Taking? Authorizing Provider  amLODipine (NORVASC) 10 MG tablet Take 1 tablet (10 mg total) by mouth daily. 12/25/10 12/25/11 Yes Sorin Laza  dexlansoprazole (DEXILANT) 60 MG capsule Take 1 capsule (60 mg total) by mouth daily. 01/17/11 02/16/11 Yes Tana Coast, PA  hydrochlorothiazide (HYDRODIURIL) 25 MG tablet Take 1 tablet (25 mg total) by mouth daily. 12/25/10 12/25/11 Yes Sorin Laza    Allergies as of 02/02/2011  . (No Known Allergies)    Family History  Problem Relation Age of Onset  . Hypertension Father   . Diabetes Father   . Hypertension Paternal Grandmother   . Diabetes Maternal Grandmother   . Colon cancer Neg Hx   . Ulcers Neg Hx     History   Social History  . Marital Status: Single    Spouse Name: N/A    Number of Children: 2  . Years of Education: N/A   Occupational History  . unemployed    Social History  Main Topics  . Smoking status: Current Some Day Smoker -- 0.5 packs/day    Types: Cigarettes  . Smokeless tobacco: Not on file  . Alcohol Use: No     last etoh 2-3 weeks ago, not heavy in years/LAST DRINK WAS ^MONTHS OR MORE  . Drug Use: Yes    Special: Marijuana     started using again last Wed, used on daily basis two years ago/LAST DOES WAS ONE MONTH AGO  . Sexually Active: Yes   Other Topics Concern  . Not on file   Social History Narrative  . No narrative on file    Review of Systems: See HPI, otherwise negative ROS  Physical Exam: BP 130/80  Pulse 97  Temp(Src)  98.1 F (36.7 C) (Temporal)  Ht 5\' 9"  (1.753 m)  Wt 236 lb 12.8 oz (107.412 kg)  BMI 34.97 kg/m2 General:   Alert,  Well-developed, well-nourished, pleasant and cooperative in NAD Head:  Normocephalic and atraumatic. Eyes:  Sclera clear, no icterus.   Conjunctiva pink. Neck:  Supple; no masses or thyromegaly. Lungs:  Clear throughout to auscultation.   No wheezes, crackles, or rhonchi. No acute distress. Heart:  Regular rate and rhythm; no murmurs, clicks, rubs,  or gallops. Abdomen:  Soft, nontender and nondistended. No masses, hepatosplenomegaly or hernias noted. Normal bowel sounds, without guarding, and without rebound.  Very mild bilateral lower quadrant abdominal tenderness without appreciable mass or organomegaly

## 2011-02-02 NOTE — Assessment & Plan Note (Signed)
Intermittent nausea nausea and vomiting, vague abdominal pain in the setting of fairly well documented, impressive weight loss this year. He did suffer a Mallory-Weiss tear related to the nausea and vomiting. His symptoms appear to be unrelating. He does describe an element of diarrhea with fat malabsorption, clinically. No evidence of chronic pancreatitis on cross-sectional imaging or obvious small bowel pathology on recent EGD or CT scanning. He likely has underlying type 2 diabetes mellitus. An element of gastroparesis could also be confounding the clinical picture. Also, bacterial overgrowth is not excluded this time as well. He does complain of night sweats from time to time which is somewhat nonspecific.  Recommendations: We'll go ahead and send a set of stool studies including a [place] smear for fecal fat. We'll also proceed with a solid phase gastric emptying study in the near future. Further recommendations to follow  As  soon as we can get the studies back for review.  He is to continue Dexilant for the time being. From again recommended he stay away from marijuana and other illicit drugs.  Will attempt to get the discharge summary from his four-day hospitalization down at [facility] in the near future.

## 2011-02-02 NOTE — Patient Instructions (Signed)
Get stool studies as ordered.  Will get a gastric emptying study.  Would like to review the records from [facility] this is a become available.  Further recommendations to follow.

## 2011-02-03 ENCOUNTER — Other Ambulatory Visit: Payer: Self-pay | Admitting: Internal Medicine

## 2011-02-03 ENCOUNTER — Telehealth: Payer: Self-pay | Admitting: Gastroenterology

## 2011-02-03 DIAGNOSIS — R110 Nausea: Secondary | ICD-10-CM

## 2011-02-03 NOTE — Telephone Encounter (Signed)
;  Pt is scheduled for GES on 02/07/11- I advised him of the time 7:45, NPO after midnight, and no stomach meds- he stated his understanding

## 2011-02-07 ENCOUNTER — Inpatient Hospital Stay (HOSPITAL_COMMUNITY): Admission: RE | Admit: 2011-02-07 | Payer: Self-pay | Source: Ambulatory Visit

## 2011-04-20 ENCOUNTER — Telehealth: Payer: Self-pay

## 2011-04-20 NOTE — Telephone Encounter (Signed)
Per RMR- pt needs follow up OV. Please schedule.

## 2011-05-17 ENCOUNTER — Encounter: Payer: Self-pay | Admitting: Internal Medicine

## 2011-05-17 NOTE — Telephone Encounter (Signed)
Pt is aware of OV on 05/30/11 at 230 pm with RMR and appt card was mailed

## 2011-05-30 ENCOUNTER — Ambulatory Visit: Payer: Self-pay | Admitting: Internal Medicine

## 2011-05-30 ENCOUNTER — Telehealth: Payer: Self-pay | Admitting: Internal Medicine

## 2011-05-30 NOTE — Telephone Encounter (Signed)
Pt was a no show

## 2011-07-18 ENCOUNTER — Encounter: Payer: Self-pay | Admitting: General Practice

## 2016-03-19 ENCOUNTER — Emergency Department (HOSPITAL_COMMUNITY)
Admission: EM | Admit: 2016-03-19 | Discharge: 2016-03-19 | Disposition: A | Discharge status: Home / Self Care | Payer: Self-pay | Attending: Emergency Medicine | Admitting: Emergency Medicine

## 2016-03-19 ENCOUNTER — Encounter (HOSPITAL_COMMUNITY): Payer: Self-pay | Admitting: Emergency Medicine

## 2016-03-19 DIAGNOSIS — S61218A Laceration without foreign body of other finger without damage to nail, initial encounter: Secondary | ICD-10-CM

## 2016-03-19 DIAGNOSIS — I160 Hypertensive urgency: Secondary | ICD-10-CM | POA: Insufficient documentation

## 2016-03-19 DIAGNOSIS — F17210 Nicotine dependence, cigarettes, uncomplicated: Secondary | ICD-10-CM | POA: Insufficient documentation

## 2016-03-19 DIAGNOSIS — Z79899 Other long term (current) drug therapy: Secondary | ICD-10-CM | POA: Insufficient documentation

## 2016-03-19 DIAGNOSIS — W268XXA Contact with other sharp object(s), not elsewhere classified, initial encounter: Secondary | ICD-10-CM | POA: Insufficient documentation

## 2016-03-19 DIAGNOSIS — Y929 Unspecified place or not applicable: Secondary | ICD-10-CM | POA: Insufficient documentation

## 2016-03-19 DIAGNOSIS — Y9389 Activity, other specified: Secondary | ICD-10-CM | POA: Insufficient documentation

## 2016-03-19 DIAGNOSIS — F1290 Cannabis use, unspecified, uncomplicated: Secondary | ICD-10-CM | POA: Insufficient documentation

## 2016-03-19 DIAGNOSIS — Y999 Unspecified external cause status: Secondary | ICD-10-CM | POA: Insufficient documentation

## 2016-03-19 DIAGNOSIS — S61211A Laceration without foreign body of left index finger without damage to nail, initial encounter: Secondary | ICD-10-CM | POA: Insufficient documentation

## 2016-03-19 NOTE — Discharge Instructions (Signed)
Please leave your current dressing in place until Tuesday night. After that a Band-Aid should probably take care of of laceration. Please allow the Steri-Strip to come off on its own, do not pull on it or remove it. Please return to the emergency room, or see Dr. Bridget Hartshorn if any signs of infection, or any continued difficulty with recurrent bleeding.

## 2016-03-19 NOTE — ED Triage Notes (Signed)
Pt states he was trying to strip back a wire with a straight razor and cut his L index finger.

## 2016-03-19 NOTE — ED Provider Notes (Signed)
[facility] DEPT Provider Note   CSN: [id number] Arrival date & time: 03/19/16  1850  By signing my name below, I, Hansel Feinstein, attest that this documentation has been prepared under the direction and in the presence of  Lily Kocher, PA-C. Electronically Signed: Hansel Feinstein, ED Scribe. 03/19/16. 7:53 PM.    History   Chief Complaint Chief Complaint  Patient presents with  . Laceration    HPI Miguel Elliott is a 40 y.o. male who presents to the Emergency Department complaining of a laceration with controlled bleeding to the left index finger that occurred at 8 am this morning. Per pt, he was stripping a wire with a razor when he accidentally cut his finger. Bleeding controlled with pressure dressing applied PTA. Pt does not take any daily anticoagulants. Tdap UTD. He denies numbness, additional injuries.    The history is provided by the patient. No language interpreter was used.  Laceration   The incident occurred 6 to 12 hours ago. The laceration is located on the left hand. Size: 1.3 cm. The laceration mechanism was a a razor. The pain is mild. The pain has been constant since onset. He reports no foreign bodies present.    Past Medical History:  Diagnosis Date  . Abnormal weight loss    30 pounds since 11/2010  . Anxiety   . Depression    no medication  . Esophageal varices (HCC) 12/22/10    grade 1 egd by Dr. Gala Romney  . Gastric erosions 12/22/10    egd by Dr. Gala Romney  . GERD (gastroesophageal reflux disease)   . Hiatal hernia 12/22/10    egd by Dr. Gala Romney  . Hypertension   . 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 Gala Romney  . Portal hypertensive gastropathy   . PTSD (post-traumatic stress disorder)     Patient Active Problem List   Diagnosis Date Noted  . Abdominal pain 01/17/2011  . Weight loss, abnormal 01/17/2011  . Vomiting 01/17/2011  . Hematemesis 12/22/2010  . Hypertensive urgency 12/22/2010  . Headache(784.0) 12/22/2010  . Dehydration 12/22/2010  .  Hematuria, microscopic 12/22/2010    Past Surgical History:  Procedure Laterality Date  . ESOPHAGOGASTRODUODENOSCOPY  12/22/2010   Procedure: ESOPHAGOGASTRODUODENOSCOPY (EGD);  Surgeon: Daneil Dolin, MD;  Location: [facility];  Service: Endoscopy;  Laterality: N/A;.    ERE, M-W tear, gastric erosions, bx benign, minimal esophageal variecs, portal gastropathy  . FOOT SURGERY    . NASAL RECONSTRUCTION  2003       Home Medications    Prior to Admission medications   Medication Sig Start Date End Date Taking? Authorizing Provider  amLODipine (NORVASC) 10 MG tablet Take 1 tablet (10 mg total) by mouth daily. 12/25/10 03/19/16 Yes Sorin June Leap, MD  hydrochlorothiazide (HYDRODIURIL) 25 MG tablet Take 1 tablet (25 mg total) by mouth daily. 12/25/10 03/19/16 Yes Sorin June Leap, MD    Family History Family History  Problem Relation Age of Onset  . Hypertension Father   . Diabetes Father   . Hypertension Paternal Grandmother   . Diabetes Maternal Grandmother   . Colon cancer Neg Hx   . Ulcers Neg Hx     Social History Social History  Substance Use Topics  . Smoking status: Current Some Day Smoker    Packs/day: 0.50    Types: Cigarettes  . Smokeless tobacco: Never Used  . Alcohol use No     Comment: last etoh 2-3 weeks ago, not heavy in years/LAST  DRINK WAS ^MONTHS OR MORE     Allergies   Patient has no known allergies.   Review of Systems Review of Systems  Skin: Positive for wound.  Neurological: Negative for numbness.  All other systems reviewed and are negative.    Physical Exam Updated Vital Signs BP 165/83 (BP Location: Left Arm)   Pulse 107   Resp 18   Ht 5\' 9"  (1.753 m)   Wt 260 lb (117.9 kg)   SpO2 98%   BMI 38.40 kg/m   Physical Exam  Constitutional: He appears well-developed and well-nourished.  HENT:  Head: Normocephalic.  Eyes: Conjunctivae are normal.  Cardiovascular: Normal rate.   Pulmonary/Chest: Effort normal. No respiratory distress.    Abdominal: He exhibits no distension.  Musculoskeletal: Normal range of motion.  1.3 cm laceration to the lateral aspect of the left index finger at the PIP joint. The capsule is not involved. The bone is not involved. No tendon involvement.   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 Pt states that this is old. He cannot fully extend at the PIP joint.   Neurological: He is alert.  Skin: Skin is warm and dry.  Psychiatric: He has a normal mood and affect. His behavior is normal.  Nursing note and vitals reviewed.    ED Treatments / Results   DIAGNOSTIC STUDIES: Oxygen Saturation is 98% on RA, normal by my interpretation.    COORDINATION OF CARE: 7:52 PM Discussed treatment plan with pt at bedside which includes wound care and pt agreed to plan.    Labs (all labs ordered are listed, but only abnormal results are displayed) Labs Reviewed - No data to display  EKG  EKG Interpretation None       Radiology No results found.  Procedures .Marland KitchenLaceration Repair Date/Time: 03/19/2016 7:59 PM Performed by: Lily Kocher Authorized by: Lily Kocher   Consent:    Consent obtained:  Verbal   Consent given by:  Patient   Risks discussed:  Infection, pain, poor cosmetic result and poor wound healing   Alternatives discussed:  No treatment Anesthesia (see MAR for exact dosages):    Anesthesia method:  None Laceration details:    Location:  Finger   Finger location:  L index finger   Length (cm):  1.3 Pre-procedure details:    Preparation:  Patient was prepped and draped in usual sterile fashion Exploration:    Wound extent: no tendon damage noted   Treatment:    Area cleansed with:  Soap and water and Betadine   Amount of cleaning:  Standard   Irrigation solution:  Tap water   Irrigation method:  Tap Skin repair:    Repair method:  Steri-Strips Approximation:    Approximation:  Close   Vermilion border: well-aligned   Post-procedure details:     Dressing:  Sterile dressing   Patient tolerance of procedure:  Tolerated well, no immediate complications   (including critical care time)  Medications Ordered in ED Medications - No data to display   Initial Impression / Assessment and Plan / ED Course  I have reviewed the triage vital signs and the nursing notes.  Pertinent labs & imaging results that were available during my care of the patient were reviewed by me and considered in my medical decision making (see chart for details).  Clinical Course     Plan for loose steri strip closure d/t the delay in getting to the ER for repair. Cleanse daily with soap and  water and monitor for signs of infection.  Final Clinical Impressions(s) / ED Diagnoses   Final diagnoses:  Laceration of index finger without damage to nail, foreign body presence unspecified, unspecified laterality, initial encounter    New Prescriptions New Prescriptions   No medications on file    **I personally performed the services described in this documentation, which was scribed in my presence. The recorded information has been reviewed and is accurate.Lily Kocher, PA-C 03/19/16 [place], DO 03/22/16 [phone]

## 2017-05-08 DIAGNOSIS — I10 Essential (primary) hypertension: Secondary | ICD-10-CM | POA: Insufficient documentation

## 2017-05-08 DIAGNOSIS — F17210 Nicotine dependence, cigarettes, uncomplicated: Secondary | ICD-10-CM | POA: Insufficient documentation

## 2017-05-08 DIAGNOSIS — M5417 Radiculopathy, lumbosacral region: Secondary | ICD-10-CM | POA: Insufficient documentation

## 2017-05-09 ENCOUNTER — Emergency Department (HOSPITAL_COMMUNITY): Payer: Self-pay

## 2017-05-09 ENCOUNTER — Encounter (HOSPITAL_COMMUNITY): Payer: Self-pay | Admitting: Emergency Medicine

## 2017-05-09 ENCOUNTER — Emergency Department (HOSPITAL_COMMUNITY)
Admission: EM | Admit: 2017-05-09 | Discharge: 2017-05-09 | Disposition: A | Discharge status: Home / Self Care | Payer: Self-pay | Attending: Emergency Medicine | Admitting: Emergency Medicine

## 2017-05-09 ENCOUNTER — Other Ambulatory Visit: Payer: Self-pay

## 2017-05-09 DIAGNOSIS — M5417 Radiculopathy, lumbosacral region: Secondary | ICD-10-CM

## 2017-05-09 LAB — URINALYSIS, ROUTINE W REFLEX MICROSCOPIC
Bacteria, UA: NONE SEEN
Bilirubin Urine: NEGATIVE
Glucose, UA: NEGATIVE mg/dL
Ketones, ur: 5 mg/dL — AB
Nitrite: NEGATIVE
Protein, ur: NEGATIVE mg/dL
Specific Gravity, Urine: 1.025 (ref 1.005–1.030)
pH: 5 (ref 5.0–8.0)

## 2017-05-09 LAB — CBG MONITORING, ED: Glucose-Capillary: 90 mg/dL (ref 65–99)

## 2017-05-09 MED ORDER — IBUPROFEN 800 MG PO TABS
800.0000 mg | ORAL_TABLET | Freq: Once | ORAL | Status: AC
  Administered 2017-05-09: 800 mg via ORAL
  Filled 2017-05-09: qty 1

## 2017-05-09 MED ORDER — CYCLOBENZAPRINE HCL 10 MG PO TABS: 10.0000 mg | ORAL_TABLET | Freq: Two times a day (BID) | ORAL | 0 refills | Status: DC | PRN

## 2017-05-09 NOTE — ED Provider Notes (Signed)
[facility] EMERGENCY DEPARTMENT Provider Note   CSN: [id number] Arrival date & time: 05/08/17  2358     History   Chief Complaint Chief Complaint  Patient presents with  . Back Pain    HPI Miguel Elliott is a 42 y.o. male.  The history is provided by the patient.  Back Pain   This is a new problem. The current episode started more than 1 week ago. The problem occurs daily. The problem has been gradually worsening. The pain is associated with no known injury. The pain is present in the lumbar spine. The pain radiates to the right thigh and left thigh. The pain is severe. The symptoms are aggravated by certain positions. Associated symptoms include numbness. Pertinent negatives include no fever, no abdominal pain, no bowel incontinence, no bladder incontinence and no dysuria. He has tried nothing for the symptoms.  Patient reports onset of low back pain over 2 weeks ago.  He reports the pain at times will radiate to both thighs.  Reports numbness throughout his feet.  He is able to ambulate.  No falls or trauma.  No abdominal pain.  No urinary symptoms.  Past Medical History:  Diagnosis Date  . Abnormal weight loss    30 pounds since 11/2010  . Anxiety   . Depression    no medication  . Esophageal varices (HCC) 12/22/10    grade 1 egd by Dr. Gala Romney  . Gastric erosions 12/22/10    egd by Dr. Gala Romney  . GERD (gastroesophageal reflux disease)   . Hiatal hernia 12/22/10    egd by Dr. Gala Romney  . Hypertension   . 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 Gala Romney  . Portal hypertensive gastropathy   . PTSD (post-traumatic stress disorder)     Patient Active Problem List   Diagnosis Date Noted  . Abdominal pain 01/17/2011  . Weight loss, abnormal 01/17/2011  . Vomiting 01/17/2011  . Hematemesis 12/22/2010  . Hypertensive urgency 12/22/2010  . Headache(784.0) 12/22/2010  . Dehydration 12/22/2010  . Hematuria, microscopic 12/22/2010    Past Surgical History:  Procedure Laterality  Date  . ESOPHAGOGASTRODUODENOSCOPY  12/22/2010   Procedure: ESOPHAGOGASTRODUODENOSCOPY (EGD);  Surgeon: Daneil Dolin, MD;  Location: [facility];  Service: Endoscopy;  Laterality: N/A;.    ERE, M-W tear, gastric erosions, bx benign, minimal esophageal variecs, portal gastropathy  . FOOT SURGERY    . NASAL RECONSTRUCTION  2003       Home Medications    Prior to Admission medications   Medication Sig Start Date End Date Taking? Authorizing Provider  amLODipine (NORVASC) 10 MG tablet Take 1 tablet (10 mg total) by mouth daily. 12/25/10 03/19/16  Elvera Lennox, MD  hydrochlorothiazide (HYDRODIURIL) 25 MG tablet Take 1 tablet (25 mg total) by mouth daily. 12/25/10 03/19/16  Elvera Lennox, MD    Family History Family History  Problem Relation Age of Onset  . Hypertension Father   . Diabetes Father   . Hypertension Paternal Grandmother   . Diabetes Maternal Grandmother   . Colon cancer Neg Hx   . Ulcers Neg Hx     Social History Social History   Tobacco Use  . Smoking status: Current Some Day Smoker    Packs/day: 0.50    Types: Cigarettes  . Smokeless tobacco: Never Used  Substance Use Topics  . Alcohol use: Yes    Comment: last etoh 2-3 weeks ago, not heavy in years/LAST DRINK WAS ^MONTHS OR MORE  .  Drug use: Yes    Types: Marijuana, Cocaine, IV, Methamphetamines    Comment: pt states he can't smoke marijuana because he is on probation     Allergies   Patient has no known allergies.   Review of Systems Review of Systems  Constitutional: Negative for fever.  Gastrointestinal: Negative for abdominal pain and bowel incontinence.  Genitourinary: Negative for bladder incontinence and dysuria.  Musculoskeletal: Positive for back pain.  Neurological: Positive for numbness.  All other systems reviewed and are negative.    Physical Exam Updated Vital Signs BP (!) 181/107   Pulse 86   Temp 98.4 F (36.9 C)   Resp 20   Ht 1.753 m (5\' 9" )   Wt 114.8 kg (253 lb)    SpO2 95%   BMI 37.36 kg/m   Physical Exam  CONSTITUTIONAL: Well developed/well nourished HEAD: Normocephalic/atraumatic EYES: EOMI ENMT: Mucous membranes moist NECK: supple no meningeal signs SPINE/BACK: Tenderness to lumbar spine, no bruising/crepitance/stepoffs noted to spine CV: S1/S2 noted, no murmurs/rubs/gallops noted LUNGS: Lungs are clear to auscultation bilaterally, no apparent distress ABDOMEN: soft, nontender NEURO: Awake/alert, equal motor 5/5 strength noted with the following: hip flexion/knee flexion/extension, foot dorsi/plantar flexion,  no sensory deficit in any dermatome. Pt is able to ambulate unassisted. EXTREMITIES: pulses normal, full ROM SKIN: warm, color normal PSYCH: no abnormalities of mood noted, alert and oriented to situation   ED Treatments / Results  Labs (all labs ordered are listed, but only abnormal results are displayed) Labs Reviewed  URINALYSIS, ROUTINE W REFLEX MICROSCOPIC - Abnormal; Notable for the following components:      Result Value   Hgb urine dipstick SMALL (*)    Ketones, ur 5 (*)    Leukocytes, UA SMALL (*)    Squamous Epithelial / LPF 0-5 (*)    All other components within normal limits  CBG MONITORING, ED    EKG  EKG Interpretation None       Radiology Dg Lumbar Spine Complete  Result Date: 05/09/2017 CLINICAL DATA:  Acute onset of lower back pain, radiating down both legs. EXAM: LUMBAR SPINE - COMPLETE 4+ VIEW COMPARISON:  CT of the abdomen and pelvis performed 01/17/2011 FINDINGS: There is no evidence of fracture or subluxation. Vertebral bodies demonstrate normal height and alignment. Intervertebral disc spaces are preserved. The visualized neural foramina are grossly unremarkable in appearance. The visualized bowel gas pattern is unremarkable in appearance; air and stool are noted within the colon. The sacroiliac joints are within normal limits. IMPRESSION: No evidence of fracture or subluxation along the lumbar spine.  Electronically Signed   By: Garald Balding M.D.   On: 05/09/2017 04:30    Procedures Procedures   Medications Ordered in ED Medications  ibuprofen (ADVIL,MOTRIN) tablet 800 mg (800 mg Oral Given 05/09/17 0428)     Initial Impression / Assessment and Plan / ED Course  I have reviewed the triage vital signs and the nursing notes.  Pertinent labs & imaging results that were available during my care of the patient were reviewed by me and considered in my medical decision making (see chart for details).     4:50 AM Imaging negative  urinalysis negative He is ambulatory, no focal motor deficits in his legs. I feel he is safe for discharge home.  We discussed strict ER return precautions.  After review of chart, he is not a good candidate for NSAID therapy due to previous gastric erosions Will prescribe Flexeril   Final Clinical Impressions(s) / ED Diagnoses  Final diagnoses:  None    ED Discharge Orders    None       Ripley Fraise, MD 05/09/17 [phone]

## 2017-05-09 NOTE — Discharge Instructions (Signed)

## 2017-05-09 NOTE — ED Triage Notes (Signed)
Pt c/o lower back pain that radiates down bilateral legs for days.

## 2017-09-25 ENCOUNTER — Emergency Department (HOSPITAL_COMMUNITY)
Admission: EM | Admit: 2017-09-25 | Discharge: 2017-09-25 | Disposition: A | Discharge status: Home / Self Care | Payer: Self-pay | Attending: Emergency Medicine | Admitting: Emergency Medicine

## 2017-09-25 ENCOUNTER — Emergency Department (HOSPITAL_COMMUNITY): Payer: Self-pay

## 2017-09-25 ENCOUNTER — Other Ambulatory Visit: Payer: Self-pay

## 2017-09-25 ENCOUNTER — Encounter (HOSPITAL_COMMUNITY): Payer: Self-pay | Admitting: Emergency Medicine

## 2017-09-25 DIAGNOSIS — R072 Precordial pain: Secondary | ICD-10-CM | POA: Insufficient documentation

## 2017-09-25 DIAGNOSIS — R9431 Abnormal electrocardiogram [ECG] [EKG]: Secondary | ICD-10-CM | POA: Insufficient documentation

## 2017-09-25 DIAGNOSIS — F17210 Nicotine dependence, cigarettes, uncomplicated: Secondary | ICD-10-CM | POA: Insufficient documentation

## 2017-09-25 DIAGNOSIS — I10 Essential (primary) hypertension: Secondary | ICD-10-CM | POA: Insufficient documentation

## 2017-09-25 DIAGNOSIS — Z79899 Other long term (current) drug therapy: Secondary | ICD-10-CM | POA: Insufficient documentation

## 2017-09-25 DIAGNOSIS — F1410 Cocaine abuse, uncomplicated: Secondary | ICD-10-CM | POA: Insufficient documentation

## 2017-09-25 LAB — RAPID URINE DRUG SCREEN, HOSP PERFORMED
Amphetamines: NEGATIVE — AB
Benzodiazepines: NEGATIVE — AB
Cocaine: POSITIVE — AB
Opiates: NEGATIVE — AB
Tetrahydrocannabinol: NEGATIVE — AB

## 2017-09-25 LAB — CBC WITH DIFFERENTIAL/PLATELET
Basophils Absolute: 0 10*3/uL (ref 0.0–0.1)
Basophils Relative: 0 %
Eosinophils Absolute: 0.1 10*3/uL (ref 0.0–0.7)
Eosinophils Relative: 1 %
HCT: 49.7 % (ref 39.0–52.0)
Hemoglobin: 16.8 g/dL (ref 13.0–17.0)
Lymphocytes Relative: 24 %
Lymphs Abs: 2 10*3/uL (ref 0.7–4.0)
MCH: 31.2 pg (ref 26.0–34.0)
MCHC: 33.8 g/dL (ref 30.0–36.0)
MCV: 92.2 fL (ref 78.0–100.0)
Monocytes Absolute: 0.9 10*3/uL (ref 0.1–1.0)
Monocytes Relative: 10 %
Neutro Abs: 5.6 10*3/uL (ref 1.7–7.7)
Neutrophils Relative %: 65 %
Platelets: 210 10*3/uL (ref 150–400)
RBC: 5.39 MIL/uL (ref 4.22–5.81)
RDW: 14.3 % (ref 11.5–15.5)
WBC: 8.7 10*3/uL (ref 4.0–10.5)

## 2017-09-25 LAB — I-STAT TROPONIN, ED: Troponin i, poc: 0.02 ng/mL (ref 0.00–0.08)

## 2017-09-25 LAB — BASIC METABOLIC PANEL
Anion gap: 9 (ref 5–15)
BUN: 12 mg/dL (ref 6–20)
CO2: 25 mmol/L (ref 22–32)
Calcium: 9 mg/dL (ref 8.9–10.3)
Chloride: 107 mmol/L (ref 98–111)
Creatinine, Ser: 0.95 mg/dL (ref 0.61–1.24)
GFR calc Af Amer: >60 mL/min (ref >60)
GFR calc non Af Amer: >60 mL/min (ref >60)
Glucose, Bld: 108 mg/dL — ABNORMAL HIGH (ref 70–99)
Potassium: 3.7 mmol/L (ref 3.5–5.1)
Sodium: 141 mmol/L (ref 135–145)

## 2017-09-25 LAB — TROPONIN I: Troponin I: <0.03 ng/mL (ref <0.03)

## 2017-09-25 MED ORDER — FAMOTIDINE 20 MG PO TABS
20.0000 mg | ORAL_TABLET | Freq: Once | ORAL | Status: AC
  Administered 2017-09-25: 20 mg via ORAL
  Filled 2017-09-25: qty 1

## 2017-09-25 MED ORDER — ASPIRIN 81 MG PO CHEW
324.0000 mg | CHEWABLE_TABLET | Freq: Once | ORAL | Status: AC
  Administered 2017-09-25: 324 mg via ORAL
  Filled 2017-09-25: qty 4

## 2017-09-25 MED ORDER — GI COCKTAIL ~~LOC~~
30.0000 mL | Freq: Once | ORAL | Status: AC
  Administered 2017-09-25: 30 mL via ORAL
  Filled 2017-09-25: qty 30

## 2017-09-25 MED ORDER — ACETAMINOPHEN 500 MG PO TABS
1000.0000 mg | ORAL_TABLET | Freq: Once | ORAL | Status: AC
  Administered 2017-09-25: 1000 mg via ORAL
  Filled 2017-09-25: qty 2

## 2017-09-25 NOTE — ED Triage Notes (Addendum)
Pt c/o chest tightness since Saturday and left arm numbness intermittently. He states his right arm has intermittent numbness for a while. Pt also states he want detox from alcohol and drugs. He states he used cocaine yesterday.

## 2017-09-25 NOTE — ED Notes (Signed)
EDP at bedside  

## 2017-09-25 NOTE — Discharge Instructions (Addendum)
It was our pleasure to provide your ER care today - we hope that you feel better.  Avoid using any cocaine - cocaine use can cause heart attacks, strokes, and death.  Use resource guide provided for community resources, and rehab resources.   Also follow up with primary care doctor in the coming week - have your blood pressure rechecked then, as it is high today.   For chest pain/ecg, follow up with cardiologist in the coming week - see referral - call office to arrange appointment.   Return to ER if worse, new symptoms, trouble breathing, persistent/recurrent chest pain, other concern.

## 2017-09-25 NOTE — ED Provider Notes (Addendum)
[facility] EMERGENCY DEPARTMENT Provider Note   CSN: [id number] Arrival date & time: 09/25/17  5643     History   Chief Complaint Chief Complaint  Patient presents with  . Chest Pain    HPI Miguel Elliott is a 42 y.o. male.  Patient c/o problems with cocaine/substance abuse, as well as chest pain for the past 2-3 days. Chest pain constant, dull, midchest, nonradiating, occurs at rest, no relation to activity or exertion, not pleuritic. No associated sob, nv or diaphoresis. No leg pain or swelling. No hx dvt or pe. No chest wall injury. Denies heartburn. Denies cough or uri symptoms. Also notes stress/anxiety about drug use. States it interferes with raising family, job, and money. Denies severe depression. Has normal appetite. Denies thoughts of harm to self/others. Denies personal hx cad or fam hx premature cad.   The history is provided by the patient.  Chest Pain   Pertinent negatives include no abdominal pain, no back pain, no cough, no fever, no headaches, no shortness of breath and no vomiting.    Past Medical History:  Diagnosis Date  . Abnormal weight loss    30 pounds since 11/2010  . Anxiety   . Depression    no medication  . Esophageal varices (HCC) 12/22/10    grade 1 egd by Dr. Gala Romney  . Gastric erosions 12/22/10    egd by Dr. Gala Romney  . GERD (gastroesophageal reflux disease)   . Hiatal hernia 12/22/10    egd by Dr. Gala Romney  . Hypertension   . 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 Gala Romney  . Portal hypertensive gastropathy   . PTSD (post-traumatic stress disorder)     Patient Active Problem List   Diagnosis Date Noted  . Abdominal pain 01/17/2011  . Weight loss, abnormal 01/17/2011  . Vomiting 01/17/2011  . Hematemesis 12/22/2010  . Hypertensive urgency 12/22/2010  . Headache(784.0) 12/22/2010  . Dehydration 12/22/2010  . Hematuria, microscopic 12/22/2010    Past Surgical History:  Procedure Laterality Date  . ESOPHAGOGASTRODUODENOSCOPY  12/22/2010     Procedure: ESOPHAGOGASTRODUODENOSCOPY (EGD);  Surgeon: Daneil Dolin, MD;  Location: [facility];  Service: Endoscopy;  Laterality: N/A;.    ERE, M-W tear, gastric erosions, bx benign, minimal esophageal variecs, portal gastropathy  . FOOT SURGERY    . NASAL RECONSTRUCTION  2003        Home Medications    Prior to Admission medications   Medication Sig Start Date End Date Taking? Authorizing Provider  amLODipine (NORVASC) 10 MG tablet Take 1 tablet (10 mg total) by mouth daily. 12/25/10 03/19/16  Elvera Lennox, MD  cyclobenzaprine (FLEXERIL) 10 MG tablet Take 1 tablet (10 mg total) by mouth 2 (two) times daily as needed for muscle spasms. 05/09/17   Ripley Fraise, MD  hydrochlorothiazide (HYDRODIURIL) 25 MG tablet Take 1 tablet (25 mg total) by mouth daily. 12/25/10 03/19/16  Elvera Lennox, MD    Family History Family History  Problem Relation Age of Onset  . Hypertension Father   . Diabetes Father   . Hypertension Paternal Grandmother   . Diabetes Maternal Grandmother   . Colon cancer Neg Hx   . Ulcers Neg Hx     Social History Social History   Tobacco Use  . Smoking status: Current Some Day Smoker    Packs/day: 0.50    Types: Cigarettes  . Smokeless tobacco: Never Used  Substance Use Topics  . Alcohol use: Yes  Comment: last etoh 2-3 weeks ago, not heavy in years/LAST DRINK WAS ^MONTHS OR MORE  . Drug use: Yes    Types: Marijuana, Cocaine, IV, Methamphetamines    Comment: pt states he can't smoke marijuana because he is on probation     Allergies   Patient has no known allergies.   Review of Systems Review of Systems  Constitutional: Negative for fever.  HENT: Negative for sore throat.   Eyes: Negative for redness.  Respiratory: Negative for cough and shortness of breath.   Cardiovascular: Positive for chest pain. Negative for leg swelling.  Gastrointestinal: Negative for abdominal pain and vomiting.  Genitourinary: Negative for flank pain.   Musculoskeletal: Negative for back pain and neck pain.  Skin: Negative for rash.  Neurological: Negative for headaches.  Hematological: Does not bruise/bleed easily.  Psychiatric/Behavioral: Negative for confusion.     Physical Exam Updated Vital Signs BP (!) 178/103 (BP Location: Right Arm)   Pulse 83   Temp 97.8 F (36.6 C) (Oral)   Resp 15   Ht 1.753 m (5\' 9" )   Wt 117.9 kg (260 lb)   SpO2 98%   BMI 38.40 kg/m   Physical Exam  Constitutional: He appears well-developed and well-nourished.  HENT:  Mouth/Throat: Oropharynx is clear and moist.  Eyes: Conjunctivae are normal.  Neck: Neck supple. No tracheal deviation present.  Cardiovascular: Normal rate, regular rhythm, normal heart sounds and intact distal pulses. Exam reveals no friction rub.  No murmur heard. Pulmonary/Chest: Effort normal and breath sounds normal. No accessory muscle usage. No respiratory distress. He exhibits tenderness.  Abdominal: Soft. Bowel sounds are normal. He exhibits no distension and no mass. There is no tenderness. There is no rebound and no guarding. No hernia.  Genitourinary:  Genitourinary Comments: No cva tenderness  Musculoskeletal: He exhibits no edema or tenderness.  Neurological: He is alert.  Skin: Skin is warm and dry. He is not diaphoretic.  Psychiatric: He has a normal mood and affect.  Nursing note and vitals reviewed.    ED Treatments / Results  Labs (all labs ordered are listed, but only abnormal results are displayed) Results for orders placed or performed during the hospital encounter of 09/25/17  CBC with Differential/Platelet  Result Value Ref Range   WBC 8.7 4.0 - 10.5 K/uL   RBC 5.39 4.22 - 5.81 MIL/uL   Hemoglobin 16.8 13.0 - 17.0 g/dL   HCT 49.7 39.0 - 52.0 %   MCV 92.2 78.0 - 100.0 fL   MCH 31.2 26.0 - 34.0 pg   MCHC 33.8 30.0 - 36.0 g/dL   RDW 14.3 11.5 - 15.5 %   Platelets 210 150 - 400 K/uL   Neutrophils Relative % 65 %   Neutro Abs 5.6 1.7 - 7.7 K/uL    Lymphocytes Relative 24 %   Lymphs Abs 2.0 0.7 - 4.0 K/uL   Monocytes Relative 10 %   Monocytes Absolute 0.9 0.1 - 1.0 K/uL   Eosinophils Relative 1 %   Eosinophils Absolute 0.1 0.0 - 0.7 K/uL   Basophils Relative 0 %   Basophils Absolute 0.0 0.0 - 0.1 K/uL  Basic metabolic panel  Result Value Ref Range   Sodium 141 135 - 145 mmol/L   Potassium 3.7 3.5 - 5.1 mmol/L   Chloride 107 98 - 111 mmol/L   CO2 25 22 - 32 mmol/L   Glucose, Bld 108 (H) 70 - 99 mg/dL   BUN 12 6 - 20 mg/dL   Creatinine, Ser 0.95  0.61 - 1.24 mg/dL   Calcium 9.0 8.9 - 10.3 mg/dL   GFR calc non Af Amer >60 >60 mL/min   GFR calc Af Amer >60 >60 mL/min   Anion gap 9 5 - 15  Troponin I  Result Value Ref Range   Troponin I <0.03 <0.03 ng/mL  Rapid urine drug screen (hospital performed)  Result Value Ref Range   Opiates NEGATIVE (A) NONE DETECTED   Cocaine POSITIVE (A) NONE DETECTED   Benzodiazepines NEGATIVE (A) NONE DETECTED   Amphetamines NEGATIVE (A) NONE DETECTED   Tetrahydrocannabinol NEGATIVE (A) NONE DETECTED   Barbiturates (A) NONE DETECTED    Result not available. Reagent lot number recalled by manufacturer.  I-stat troponin, ED  Result Value Ref Range   Troponin i, poc 0.02 0.00 - 0.08 ng/mL   Comment 3            EKG EKG Interpretation  Date/Time:  Tuesday September 25 2017 06:25:34 EDT Ventricular Rate:  85 PR Interval:    QRS Duration: 101 QT Interval:  360 QTC Calculation: 428 R Axis:   6 Text Interpretation:  Sinus rhythm Nonspecific T wave abnormality No previous tracing Confirmed by Lajean Saver [phone]) on 09/25/2017 7:05:08 AM   Radiology No results found.  Procedures Procedures (including critical care time)  Medications Ordered in ED Medications  gi cocktail (Maalox,Lidocaine,Donnatal) (has no administration in time range)  famotidine (PEPCID) tablet 20 mg (has no administration in time range)  acetaminophen (TYLENOL) tablet 1,000 mg (has no administration in time range)   aspirin chewable tablet 324 mg (324 mg Oral Given 09/25/17 5170)     Initial Impression / Assessment and Plan / ED Course  I have reviewed the triage vital signs and the nursing notes.  Pertinent labs & imaging results that were available during my care of the patient were reviewed by me and considered in my medical decision making (see chart for details).  Iv ns. Labs. Monitor. Ecg. Cxr.  Reviewed nursing notes and prior charts for additional history.   After symptoms for 2-3 days, constant, trop is normal and delta troponin is also normal.   Pepcid, gi cocktail and acetaminophen given for symptom relief.  Discussed w pt re substance abuse, anxiety/stress, will provide resource guide for community support/rehab/treatment options.   On recheck, resting comfortably, no distress, cp has resolved.   Pt currently stable for d/c.     Final Clinical Impressions(s) / ED Diagnoses   Final diagnoses:  None    ED Discharge Orders    None           Lajean Saver, MD 09/25/17 1134

## 2018-09-06 ENCOUNTER — Encounter (HOSPITAL_COMMUNITY): Payer: Self-pay | Admitting: Emergency Medicine

## 2018-09-06 ENCOUNTER — Emergency Department (HOSPITAL_COMMUNITY)
Admission: EM | Admit: 2018-09-06 | Discharge: 2018-09-06 | Disposition: A | Discharge status: Home / Self Care | Payer: Self-pay | Attending: Emergency Medicine | Admitting: Emergency Medicine

## 2018-09-06 ENCOUNTER — Other Ambulatory Visit: Payer: Self-pay

## 2018-09-06 DIAGNOSIS — Y929 Unspecified place or not applicable: Secondary | ICD-10-CM | POA: Insufficient documentation

## 2018-09-06 DIAGNOSIS — F17210 Nicotine dependence, cigarettes, uncomplicated: Secondary | ICD-10-CM | POA: Insufficient documentation

## 2018-09-06 DIAGNOSIS — W57XXXA Bitten or stung by nonvenomous insect and other nonvenomous arthropods, initial encounter: Secondary | ICD-10-CM | POA: Insufficient documentation

## 2018-09-06 DIAGNOSIS — Y939 Activity, unspecified: Secondary | ICD-10-CM | POA: Insufficient documentation

## 2018-09-06 DIAGNOSIS — R531 Weakness: Secondary | ICD-10-CM | POA: Insufficient documentation

## 2018-09-06 DIAGNOSIS — Y999 Unspecified external cause status: Secondary | ICD-10-CM | POA: Insufficient documentation

## 2018-09-06 DIAGNOSIS — S20469A Insect bite (nonvenomous) of unspecified back wall of thorax, initial encounter: Secondary | ICD-10-CM | POA: Insufficient documentation

## 2018-09-06 DIAGNOSIS — I10 Essential (primary) hypertension: Secondary | ICD-10-CM | POA: Insufficient documentation

## 2018-09-06 MED ORDER — DOXYCYCLINE HYCLATE 100 MG PO CAPS: 100.0000 mg | ORAL_CAPSULE | Freq: Two times a day (BID) | ORAL | 0 refills | Status: DC

## 2018-09-06 NOTE — ED Triage Notes (Signed)
Pt states that he pulled a tick off of his back last night and since then he has diarrhea vomiting headache and sweating spells

## 2018-09-06 NOTE — ED Triage Notes (Signed)
Pt also states that the site is sore and he is having weakness since then

## 2018-09-06 NOTE — Discharge Instructions (Signed)
Doxycycline as prescribed.  Ibuprofen 600 mg rotated with Tylenol 1000 mg every 4 hours as needed for pain or fever.  Return to the ER if symptoms significantly worsen or change.

## 2018-09-06 NOTE — ED Provider Notes (Signed)
[facility] EMERGENCY DEPARTMENT Provider Note   CSN: [id number] Arrival date & time: 09/06/18  1411    History   Chief Complaint Chief Complaint  Patient presents with  . Tick Removal  . Weakness    HPI Miguel Elliott is a 43 y.o. male.     Patient is a 43 year old male with past medical history of GERD, hypertension, portal hypertensive gastropathy.  He presents today for evaluation of a tick bite.  He states that yesterday he noticed a tick to his mid back.  His friend removed it.  Since that time he has felt fevered and more burning and irritation to his back.  He also describes headache.  The history is provided by the patient.  Weakness  Severity:  Moderate Onset quality:  Sudden Duration:  1 day Timing:  Constant Progression:  Worsening Chronicity:  New Relieved by:  Nothing Worsened by:  Nothing Ineffective treatments:  None tried   Past Medical History:  Diagnosis Date  . Abnormal weight loss    30 pounds since 11/2010  . Anxiety   . Depression    no medication  . Esophageal varices (HCC) 12/22/10    grade 1 egd by Dr. Gala Romney  . Gastric erosions 12/22/10    egd by Dr. Gala Romney  . GERD (gastroesophageal reflux disease)   . Hiatal hernia 12/22/10    egd by Dr. Gala Romney  . Hypertension   . 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 Gala Romney  . Portal hypertensive gastropathy   . PTSD (post-traumatic stress disorder)     Patient Active Problem List   Diagnosis Date Noted  . Abdominal pain 01/17/2011  . Weight loss, abnormal 01/17/2011  . Vomiting 01/17/2011  . Hematemesis 12/22/2010  . Hypertensive urgency 12/22/2010  . Headache(784.0) 12/22/2010  . Dehydration 12/22/2010  . Hematuria, microscopic 12/22/2010    Past Surgical History:  Procedure Laterality Date  . ESOPHAGOGASTRODUODENOSCOPY  12/22/2010   Procedure: ESOPHAGOGASTRODUODENOSCOPY (EGD);  Surgeon: Daneil Dolin, MD;  Location: [facility];  Service: Endoscopy;  Laterality: N/A;.    ERE, M-W  tear, gastric erosions, bx benign, minimal esophageal variecs, portal gastropathy  . FOOT SURGERY    . NASAL RECONSTRUCTION  2003        Home Medications    Prior to Admission medications   Not on File    Family History Family History  Problem Relation Age of Onset  . Hypertension Father   . Diabetes Father   . Hypertension Paternal Grandmother   . Diabetes Maternal Grandmother   . Colon cancer Neg Hx   . Ulcers Neg Hx     Social History Social History   Tobacco Use  . Smoking status: Current Some Day Smoker    Packs/day: 0.25    Types: Cigarettes  . Smokeless tobacco: Never Used  Substance Use Topics  . Alcohol use: Yes    Alcohol/week: 15.0 standard drinks    Types: 15 Shots of liquor per week    Comment: last etoh 2-3 weeks ago, not heavy in years/LAST DRINK WAS ^MONTHS OR MORE  . Drug use: Yes    Types: Marijuana, Cocaine, IV, Methamphetamines    Comment: pt states he can't smoke marijuana because he is on probation     Allergies   Patient has no known allergies.   Review of Systems Review of Systems  Neurological: Positive for weakness.  All other systems reviewed and are negative.    Physical Exam Updated Vital  Signs BP 108/86 (BP Location: Right Arm)   Pulse 88   Temp 98.7 F (37.1 C) (Oral)   Resp 14   Ht 5\' 9"  (1.753 m)   Wt 120.2 kg   SpO2 99%   BMI 39.13 kg/m   Physical Exam Vitals signs and nursing note reviewed.  Constitutional:      General: He is not in acute distress.    Appearance: He is well-developed. He is not diaphoretic.  HENT:     Head: Normocephalic and atraumatic.  Neck:     Musculoskeletal: Normal range of motion and neck supple.  Cardiovascular:     Rate and Rhythm: Normal rate and regular rhythm.     Heart sounds: No murmur. No friction rub.  Pulmonary:     Effort: Pulmonary effort is normal. No respiratory distress.     Breath sounds: Normal breath sounds. No wheezing or rales.  Abdominal:     General:  Bowel sounds are normal. There is no distension.     Palpations: Abdomen is soft.     Tenderness: There is no abdominal tenderness.  Musculoskeletal: Normal range of motion.  Skin:    General: Skin is warm and dry.     Comments: There is an erythematous lesion to the mid back between the shoulder blades.  There is a puncture at the center of this erythema.  It does not appear to be a bull's-eye, just generalized erythema.  Neurological:     Mental Status: He is alert and oriented to person, place, and time.     Coordination: Coordination normal.      ED Treatments / Results  Labs (all labs ordered are listed, but only abnormal results are displayed) Labs Reviewed - No data to display  EKG None  Radiology No results found.  Procedures Procedures (including critical care time)  Medications Ordered in ED Medications - No data to display   Initial Impression / Assessment and Plan / ED Course  I have reviewed the triage vital signs and the nursing notes.  Pertinent labs & imaging results that were available during my care of the patient were reviewed by me and considered in my medical decision making (see chart for details).  Patient will be treated for asymptomatic tick bite with doxycycline.  He is to follow-up with primary doctor as needed.  Final Clinical Impressions(s) / ED Diagnoses   Final diagnoses:  None    ED Discharge Orders    None       Veryl Speak, MD 09/06/18 [phone]

## 2019-02-14 ENCOUNTER — Other Ambulatory Visit: Payer: Self-pay

## 2019-02-14 ENCOUNTER — Emergency Department (HOSPITAL_COMMUNITY): Payer: BC Managed Care – PPO

## 2019-02-14 ENCOUNTER — Emergency Department (HOSPITAL_COMMUNITY)
Admission: EM | Admit: 2019-02-14 | Discharge: 2019-02-14 | Disposition: A | Discharge status: Home / Self Care | Payer: BC Managed Care – PPO | Attending: Emergency Medicine | Admitting: Emergency Medicine

## 2019-02-14 ENCOUNTER — Encounter (HOSPITAL_COMMUNITY): Payer: Self-pay | Admitting: Emergency Medicine

## 2019-02-14 DIAGNOSIS — Z20828 Contact with and (suspected) exposure to other viral communicable diseases: Secondary | ICD-10-CM | POA: Diagnosis not present

## 2019-02-14 DIAGNOSIS — R509 Fever, unspecified: Secondary | ICD-10-CM | POA: Diagnosis not present

## 2019-02-14 DIAGNOSIS — B349 Viral infection, unspecified: Secondary | ICD-10-CM | POA: Diagnosis not present

## 2019-02-14 DIAGNOSIS — R05 Cough: Secondary | ICD-10-CM | POA: Diagnosis not present

## 2019-02-14 DIAGNOSIS — F17210 Nicotine dependence, cigarettes, uncomplicated: Secondary | ICD-10-CM | POA: Insufficient documentation

## 2019-02-14 DIAGNOSIS — I10 Essential (primary) hypertension: Secondary | ICD-10-CM | POA: Diagnosis not present

## 2019-02-14 LAB — URINALYSIS, ROUTINE W REFLEX MICROSCOPIC
Bacteria, UA: NONE SEEN
Bilirubin Urine: NEGATIVE
Glucose, UA: NEGATIVE mg/dL
Ketones, ur: 5 mg/dL — AB
Leukocytes,Ua: NEGATIVE
Nitrite: NEGATIVE
Protein, ur: NEGATIVE mg/dL
Specific Gravity, Urine: 1.023 (ref 1.005–1.030)
pH: 5 (ref 5.0–8.0)

## 2019-02-14 LAB — CBC WITH DIFFERENTIAL/PLATELET
Abs Immature Granulocytes: 0.01 10*3/uL (ref 0.00–0.07)
Basophils Absolute: 0 10*3/uL (ref 0.0–0.1)
Basophils Relative: 0 %
Eosinophils Absolute: 0 10*3/uL (ref 0.0–0.5)
Eosinophils Relative: 0 %
HCT: 50 % (ref 39.0–52.0)
Hemoglobin: 16.4 g/dL (ref 13.0–17.0)
Immature Granulocytes: 0 %
Lymphocytes Relative: 16 %
Lymphs Abs: 1.2 10*3/uL (ref 0.7–4.0)
MCH: 30.8 pg (ref 26.0–34.0)
MCHC: 32.8 g/dL (ref 30.0–36.0)
MCV: 94 fL (ref 80.0–100.0)
Monocytes Absolute: 0.7 10*3/uL (ref 0.1–1.0)
Monocytes Relative: 10 %
Neutro Abs: 5.4 10*3/uL (ref 1.7–7.7)
Neutrophils Relative %: 74 %
Platelets: 212 10*3/uL (ref 150–400)
RBC: 5.32 MIL/uL (ref 4.22–5.81)
RDW: 14.1 % (ref 11.5–15.5)
WBC: 7.4 10*3/uL (ref 4.0–10.5)
nRBC: 0 % (ref 0.0–0.2)

## 2019-02-14 LAB — BASIC METABOLIC PANEL
Anion gap: 10 (ref 5–15)
BUN: 14 mg/dL (ref 6–20)
CO2: 24 mmol/L (ref 22–32)
Calcium: 9 mg/dL (ref 8.9–10.3)
Chloride: 103 mmol/L (ref 98–111)
Creatinine, Ser: 1.14 mg/dL (ref 0.61–1.24)
GFR calc Af Amer: >60 mL/min (ref >60)
GFR calc non Af Amer: >60 mL/min (ref >60)
Glucose, Bld: 115 mg/dL — ABNORMAL HIGH (ref 70–99)
Potassium: 3.7 mmol/L (ref 3.5–5.1)
Sodium: 137 mmol/L (ref 135–145)

## 2019-02-14 LAB — SARS CORONAVIRUS 2 (TAT 6-24 HRS): SARS Coronavirus 2: NEGATIVE

## 2019-02-14 MED ORDER — AMLODIPINE BESYLATE 5 MG PO TABS: 5.0000 mg | ORAL_TABLET | Freq: Every day | ORAL | 0 refills | Status: DC

## 2019-02-14 MED ORDER — PROMETHAZINE HCL 25 MG PO TABS: 25.0000 mg | ORAL_TABLET | Freq: Four times a day (QID) | ORAL | 0 refills | Status: DC | PRN

## 2019-02-14 NOTE — Discharge Instructions (Addendum)
Your Covid test is pending.  You may follow this on the MyChart app, otherwise you will be contacted if your results are positive.  Tylenol or ibuprofen if needed for body aches or fever.  Also, your blood pressure is elevated.  We will start you back on your blood pressure medication.  You will be to contact one of the providers listed to establish primary care and have your blood pressure rechecked.  You will need to self isolate at home and wear a mask when you are around others until your Covid test is back.

## 2019-02-14 NOTE — ED Triage Notes (Signed)
Patient reports body aches x 3 weeks, cough since Monday, fever, fatigue, and "itchy throat since Monday.

## 2019-02-14 NOTE — ED Notes (Signed)
Patient was informed that we need a urine sample.

## 2019-02-15 NOTE — ED Provider Notes (Signed)
[facility] EMERGENCY DEPARTMENT Provider Note   CSN: [id number] Arrival date & time: 02/14/19  1043     History   Chief Complaint Chief Complaint  Patient presents with  . Generalized Body Aches    HPI AZAM UDDIN is a 43 y.o. male.     HPI   ADREW DUNST is a 43 y.o. male who presents to the Emergency Department complaining of generalized body aches, cough, nausea, sore throat and fatigue.  Symptoms have been present for 3 weeks, worse for 4 days.  Symptoms have been associated with low grade fever.  He describes his cough as non-productive and not associated with chest pain or shortness of breath.  He states that he was recently exposed to someone with COVID.  He denies vomiting, diarrhea, loss of taste or smell.      Past Medical History:  Diagnosis Date  . Abnormal weight loss    30 pounds since 11/2010  . Anxiety   . Depression    no medication  . Esophageal varices (HCC) 12/22/10    grade 1 egd by Dr. Gala Romney  . Gastric erosions 12/22/10    egd by Dr. Gala Romney  . GERD (gastroesophageal reflux disease)   . Hiatal hernia 12/22/10    egd by Dr. Gala Romney  . Hypertension   . 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 Gala Romney  . Portal hypertensive gastropathy   . PTSD (post-traumatic stress disorder)     Patient Active Problem List   Diagnosis Date Noted  . Abdominal pain 01/17/2011  . Weight loss, abnormal 01/17/2011  . Vomiting 01/17/2011  . Hematemesis 12/22/2010  . Hypertensive urgency 12/22/2010  . Headache(784.0) 12/22/2010  . Dehydration 12/22/2010  . Hematuria, microscopic 12/22/2010    Past Surgical History:  Procedure Laterality Date  . ESOPHAGOGASTRODUODENOSCOPY  12/22/2010   Procedure: ESOPHAGOGASTRODUODENOSCOPY (EGD);  Surgeon: Daneil Dolin, MD;  Location: [facility];  Service: Endoscopy;  Laterality: N/A;.    ERE, M-W tear, gastric erosions, bx benign, minimal esophageal variecs, portal gastropathy  . FOOT SURGERY    . NASAL RECONSTRUCTION   2003        Home Medications    Prior to Admission medications   Medication Sig Start Date End Date Taking? Authorizing Provider  amLODipine (NORVASC) 5 MG tablet Take 1 tablet (5 mg total) by mouth daily. 02/14/19   , , PA-C  doxycycline (VIBRAMYCIN) 100 MG capsule Take 1 capsule (100 mg total) by mouth 2 (two) times daily. One po bid x 7 days Patient not taking: Reported on 02/14/2019 09/06/18   Veryl Speak, MD  promethazine (PHENERGAN) 25 MG tablet Take 1 tablet (25 mg total) by mouth every 6 (six) hours as needed for nausea or vomiting. 02/14/19   Kem Parkinson, PA-C    Family History Family History  Problem Relation Age of Onset  . Hypertension Father   . Diabetes Father   . Hypertension Paternal Grandmother   . Diabetes Maternal Grandmother   . Colon cancer Neg Hx   . Ulcers Neg Hx     Social History Social History   Tobacco Use  . Smoking status: Current Some Day Smoker    Packs/day: 0.25    Types: Cigarettes  . Smokeless tobacco: Never Used  Substance Use Topics  . Alcohol use: Yes    Alcohol/week: 15.0 standard drinks    Types: 15 Shots of liquor per week    Comment: Drinks a fifth a week  .  Drug use: Yes    Types: Marijuana, Cocaine, IV, Methamphetamines     Allergies   Patient has no known allergies.   Review of Systems Review of Systems  Constitutional: Positive for fever. Negative for activity change, appetite change and chills.  HENT: Positive for congestion and sore throat. Negative for ear pain, facial swelling, trouble swallowing and voice change.   Eyes: Negative for pain and visual disturbance.  Respiratory: Positive for cough. Negative for chest tightness, shortness of breath and wheezing.   Cardiovascular: Negative for chest pain.  Gastrointestinal: Positive for nausea. Negative for abdominal pain, diarrhea and vomiting.  Genitourinary: Negative for difficulty urinating, dysuria and flank pain.  Musculoskeletal: Positive  for myalgias. Negative for arthralgias, neck pain and neck stiffness.  Skin: Negative for color change and rash.  Neurological: Negative for dizziness, speech difficulty, numbness and headaches.  Hematological: Negative for adenopathy.     Physical Exam Updated Vital Signs BP (!) 159/71   Pulse 76   Temp 98.4 F (36.9 C)   Resp 16   Ht 5\' 9"  (1.753 m)   Wt 127 kg   SpO2 99%   BMI 41.35 kg/m   Physical Exam Vitals signs and nursing note reviewed.  Constitutional:      General: He is not in acute distress.    Appearance: Normal appearance. He is well-developed. He is not ill-appearing or toxic-appearing.  HENT:     Head: Normocephalic.     Right Ear: Tympanic membrane and ear canal normal.     Left Ear: Tympanic membrane and ear canal normal.     Mouth/Throat:     Mouth: Mucous membranes are moist.     Pharynx: Uvula midline. No oropharyngeal exudate or posterior oropharyngeal erythema.     Comments: Mild erythema of the oropharynx.  No erythema or exudates.  Uvula is midline and non-edematous.   Eyes:     Pupils: Pupils are equal, round, and reactive to light.  Neck:     Musculoskeletal: Full passive range of motion without pain, normal range of motion and neck supple.     Trachea: Phonation normal.  Cardiovascular:     Rate and Rhythm: Normal rate and regular rhythm.     Pulses: Normal pulses.  Pulmonary:     Effort: Pulmonary effort is normal. No respiratory distress.     Breath sounds: Normal breath sounds. No rales.  Chest:     Chest wall: No tenderness.  Abdominal:     General: There is no distension.     Palpations: Abdomen is soft.     Tenderness: There is no abdominal tenderness.  Musculoskeletal: Normal range of motion.  Lymphadenopathy:     Cervical: No cervical adenopathy.  Skin:    General: Skin is warm.     Findings: No rash.  Neurological:     General: No focal deficit present.     Mental Status: He is alert.     Sensory: No sensory deficit.      Motor: No weakness or abnormal muscle tone.      ED Treatments / Results  Labs (all labs ordered are listed, but only abnormal results are displayed) Labs Reviewed  BASIC METABOLIC PANEL - Abnormal; Notable for the following components:      Result Value   Glucose, Bld 115 (*)    All other components within normal limits  URINALYSIS, ROUTINE W REFLEX MICROSCOPIC - Abnormal; Notable for the following components:   Hgb urine dipstick SMALL (*)  Ketones, ur 5 (*)    All other components within normal limits  SARS CORONAVIRUS 2 (TAT 6-24 HRS)  CBC WITH DIFFERENTIAL/PLATELET    EKG None  Radiology Dg Chest Portable 1 View  Result Date: 02/14/2019 CLINICAL DATA:  Suspected COVID-19.  Body aches and cough.  Fever. EXAM: PORTABLE CHEST 1 VIEW COMPARISON:  09/25/2017. FINDINGS: Mediastinum and hilar structures stable. Heart size stable. Lungs are clear. No pleural effusion or pneumothorax. No acute bony abnormality. IMPRESSION: No acute cardiopulmonary disease identified. No focal infiltrate noted. Electronically Signed   By: Marcello Moores  Register   On: 02/14/2019 11:48    Procedures Procedures (including critical care time)  Medications Ordered in ED Medications - No data to display   Initial Impression / Assessment and Plan / ED Course  I have reviewed the triage vital signs and the nursing notes.  Pertinent labs & imaging results that were available during my care of the patient were reviewed by me and considered in my medical decision making (see chart for details).        Pt well appearing, non-toxic.  Labs reassuring.  COVID pending.  Sx's likely related to a viral illness.  Pt noted to be hypertensive, states he has been off of his BP medication since being incarcerated.  Will refill his medication and he agrees to establish primary care.  Referral info provided.     Final Clinical Impressions(s) / ED Diagnoses   Final diagnoses:  Viral illness  Hypertension,  unspecified type    ED Discharge Orders         Ordered    amLODipine (NORVASC) 5 MG tablet  Daily     02/14/19 1453    promethazine (PHENERGAN) 25 MG tablet  Every 6 hours PRN     02/14/19 V[place], , PA-C 02/15/19 2045    Nat Christen, MD 02/16/19 [phone]

## 2019-02-17 ENCOUNTER — Telehealth: Payer: Self-pay | Admitting: *Deleted

## 2019-02-17 NOTE — Telephone Encounter (Signed)
Patient given negative COVID results ,also signed up for my chart .

## 2019-05-23 ENCOUNTER — Emergency Department (HOSPITAL_COMMUNITY): Payer: BC Managed Care – PPO

## 2019-05-23 ENCOUNTER — Encounter (HOSPITAL_COMMUNITY): Payer: Self-pay | Admitting: Emergency Medicine

## 2019-05-23 ENCOUNTER — Observation Stay (HOSPITAL_COMMUNITY)
Admission: EM | Admit: 2019-05-23 | Discharge: 2019-05-24 | Disposition: A | Discharge status: Home / Self Care | Payer: BC Managed Care – PPO | Attending: Family Medicine | Admitting: Family Medicine

## 2019-05-23 ENCOUNTER — Other Ambulatory Visit: Payer: Self-pay

## 2019-05-23 DIAGNOSIS — R0602 Shortness of breath: Secondary | ICD-10-CM | POA: Insufficient documentation

## 2019-05-23 DIAGNOSIS — F1910 Other psychoactive substance abuse, uncomplicated: Secondary | ICD-10-CM | POA: Diagnosis not present

## 2019-05-23 DIAGNOSIS — R0789 Other chest pain: Principal | ICD-10-CM | POA: Insufficient documentation

## 2019-05-23 DIAGNOSIS — Z20822 Contact with and (suspected) exposure to covid-19: Secondary | ICD-10-CM | POA: Insufficient documentation

## 2019-05-23 DIAGNOSIS — E669 Obesity, unspecified: Secondary | ICD-10-CM | POA: Diagnosis not present

## 2019-05-23 DIAGNOSIS — E1165 Type 2 diabetes mellitus with hyperglycemia: Secondary | ICD-10-CM | POA: Diagnosis not present

## 2019-05-23 DIAGNOSIS — R079 Chest pain, unspecified: Secondary | ICD-10-CM | POA: Diagnosis present

## 2019-05-23 DIAGNOSIS — E119 Type 2 diabetes mellitus without complications: Secondary | ICD-10-CM

## 2019-05-23 DIAGNOSIS — I10 Essential (primary) hypertension: Secondary | ICD-10-CM | POA: Insufficient documentation

## 2019-05-23 DIAGNOSIS — R739 Hyperglycemia, unspecified: Secondary | ICD-10-CM | POA: Diagnosis present

## 2019-05-23 DIAGNOSIS — K219 Gastro-esophageal reflux disease without esophagitis: Secondary | ICD-10-CM | POA: Insufficient documentation

## 2019-05-23 DIAGNOSIS — F418 Other specified anxiety disorders: Secondary | ICD-10-CM | POA: Diagnosis not present

## 2019-05-23 LAB — COMPREHENSIVE METABOLIC PANEL
ALT: 22 U/L (ref 0–44)
AST: 21 U/L (ref 15–41)
Albumin: 4.2 g/dL (ref 3.5–5.0)
Alkaline Phosphatase: 60 U/L (ref 38–126)
Anion gap: 9 (ref 5–15)
BUN: 16 mg/dL (ref 6–20)
CO2: 24 mmol/L (ref 22–32)
Calcium: 9.2 mg/dL (ref 8.9–10.3)
Chloride: 106 mmol/L (ref 98–111)
Creatinine, Ser: 1.05 mg/dL (ref 0.61–1.24)
GFR calc Af Amer: >60 mL/min (ref >60)
GFR calc non Af Amer: >60 mL/min (ref >60)
Glucose, Bld: 144 mg/dL — ABNORMAL HIGH (ref 70–99)
Potassium: 3.7 mmol/L (ref 3.5–5.1)
Sodium: 139 mmol/L (ref 135–145)
Total Bilirubin: 0.9 mg/dL (ref 0.3–1.2)
Total Protein: 7.6 g/dL (ref 6.5–8.1)

## 2019-05-23 LAB — CBC WITH DIFFERENTIAL/PLATELET
Abs Immature Granulocytes: 0.02 10*3/uL (ref 0.00–0.07)
Basophils Absolute: 0 10*3/uL (ref 0.0–0.1)
Basophils Relative: 1 %
Eosinophils Absolute: 0.1 10*3/uL (ref 0.0–0.5)
Eosinophils Relative: 1 %
HCT: 51.8 % (ref 39.0–52.0)
Hemoglobin: 16.7 g/dL (ref 13.0–17.0)
Immature Granulocytes: 0 %
Lymphocytes Relative: 24 %
Lymphs Abs: 1.8 10*3/uL (ref 0.7–4.0)
MCH: 30.4 pg (ref 26.0–34.0)
MCHC: 32.2 g/dL (ref 30.0–36.0)
MCV: 94.4 fL (ref 80.0–100.0)
Monocytes Absolute: 0.7 10*3/uL (ref 0.1–1.0)
Monocytes Relative: 10 %
Neutro Abs: 4.6 10*3/uL (ref 1.7–7.7)
Neutrophils Relative %: 64 %
Platelets: 206 10*3/uL (ref 150–400)
RBC: 5.49 MIL/uL (ref 4.22–5.81)
RDW: 14.6 % (ref 11.5–15.5)
WBC: 7.2 10*3/uL (ref 4.0–10.5)
nRBC: 0 % (ref 0.0–0.2)

## 2019-05-23 LAB — TROPONIN I (HIGH SENSITIVITY)
Troponin I (High Sensitivity): 79 ng/L — ABNORMAL HIGH (ref <18)
Troponin I (High Sensitivity): 78 ng/L — ABNORMAL HIGH (ref <18)

## 2019-05-23 LAB — BRAIN NATRIURETIC PEPTIDE: B Natriuretic Peptide: 66 pg/mL (ref 0.0–100.0)

## 2019-05-23 LAB — D-DIMER, QUANTITATIVE: D-Dimer, Quant: 0.32 ug/mL-FEU (ref 0.00–0.50)

## 2019-05-23 LAB — MAGNESIUM: Magnesium: 2.3 mg/dL (ref 1.7–2.4)

## 2019-05-23 LAB — PHOSPHORUS: Phosphorus: 2.9 mg/dL (ref 2.5–4.6)

## 2019-05-23 MED ORDER — ACETAMINOPHEN 325 MG PO TABS: 650.0000 mg | ORAL_TABLET | Freq: Four times a day (QID) | ORAL | Status: DC | PRN

## 2019-05-23 MED ORDER — ASPIRIN EC 81 MG PO TBEC
162.0000 mg | DELAYED_RELEASE_TABLET | Freq: Every day | ORAL | Status: DC
  Administered 2019-05-24: 09:00:00 162 mg via ORAL
  Filled 2019-05-23: qty 2

## 2019-05-23 MED ORDER — ENOXAPARIN SODIUM 40 MG/0.4ML ~~LOC~~ SOLN: 40.0000 mg | SUBCUTANEOUS | Status: DC

## 2019-05-23 MED ORDER — METOPROLOL TARTRATE 25 MG PO TABS
25.0000 mg | ORAL_TABLET | Freq: Two times a day (BID) | ORAL | Status: DC
  Administered 2019-05-24: 25 mg via ORAL
  Filled 2019-05-23: qty 1

## 2019-05-23 MED ORDER — ACETAMINOPHEN 650 MG RE SUPP: 650.0000 mg | Freq: Four times a day (QID) | RECTAL | Status: DC | PRN

## 2019-05-23 MED ORDER — METOPROLOL TARTRATE 5 MG/5ML IV SOLN
5.0000 mg | Freq: Once | INTRAVENOUS | Status: AC
  Administered 2019-05-23: 23:00:00 5 mg via INTRAVENOUS
  Filled 2019-05-23: qty 5

## 2019-05-23 MED ORDER — ASPIRIN 325 MG PO TABS
325.0000 mg | ORAL_TABLET | Freq: Once | ORAL | Status: AC
  Administered 2019-05-23: 325 mg via ORAL
  Filled 2019-05-23: qty 1

## 2019-05-23 MED ORDER — ONDANSETRON HCL 4 MG/2ML IJ SOLN: 4.0000 mg | Freq: Four times a day (QID) | INTRAMUSCULAR | Status: DC | PRN

## 2019-05-23 NOTE — ED Provider Notes (Signed)
[facility] Provider Note   CSN: [id number] Arrival date & time: 05/23/19  1429     History Chief Complaint  Patient presents with  . Arm Pain  . Chest Pain    Miguel Elliott is a 44 y.o. male with PMHx HTN (not currently on medication) who presents to the ED today initially complaining of left arm paresthesias intermittently for the past 2 weeks. However pt reports he has also been having intermittent chest pains substernally and SOB with bilateral leg swelling and feeling like his abdomen is more distended. Pt is unsure if the arm tingling is in association with the chest pains or not. He does endorse positive FHx of cardiac disease and MI however unsure how old family members were when they had their MI. Pt denies hx of DVT/PE. No hx of prolonged travel or immobilization. No exogenous hormone use. No hemoptysis. No active malignancy. Pt denies fevers, chills, cough, abdominal pain, nausea, vomiting, diaphoresis, unilateral leg swelling, or any other associated symptoms. Pt is a current every day smoker with a 10 pack year history.   The history is provided by the patient and medical records.       Past Medical History:  Diagnosis Date  . Abnormal weight loss    30 pounds since 11/2010  . Anxiety   . Depression    no medication  . Esophageal varices (HCC) 12/22/10    grade 1 egd by Dr. Gala Romney  . Gastric erosions 12/22/10    egd by Dr. Gala Romney  . GERD (gastroesophageal reflux disease)   . Hiatal hernia 12/22/10    egd by Dr. Gala Romney  . Hypertension   . 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 Gala Romney  . Portal hypertensive gastropathy   . PTSD (post-traumatic stress disorder)     Patient Active Problem List   Diagnosis Date Noted  . Abdominal pain 01/17/2011  . Weight loss, abnormal 01/17/2011  . Vomiting 01/17/2011  . Hematemesis 12/22/2010  . Hypertensive urgency 12/22/2010  . Headache(784.0) 12/22/2010  . Dehydration 12/22/2010  . Hematuria,  microscopic 12/22/2010    Past Surgical History:  Procedure Laterality Date  . ESOPHAGOGASTRODUODENOSCOPY  12/22/2010   Procedure: ESOPHAGOGASTRODUODENOSCOPY (EGD);  Surgeon: Daneil Dolin, MD;  [facility];  Service: Endoscopy;  Laterality: N/A;.    ERE, M-W tear, gastric erosions, bx benign, minimal esophageal variecs, portal gastropathy  . FOOT SURGERY    . NASAL RECONSTRUCTION  2003       Family History  Problem Relation Age of Onset  . Hypertension Father   . Diabetes Father   . Hypertension Paternal Grandmother   . Diabetes Maternal Grandmother   . Colon cancer Neg Hx   . Ulcers Neg Hx     Social History   Tobacco Use  . Smoking status: Current Some Day Smoker    Packs/day: 0.25    Types: Cigarettes  . Smokeless tobacco: Never Used  Substance Use Topics  . Alcohol use: Yes    Alcohol/week: 15.0 standard drinks    Types: 15 Shots of liquor per week    Comment: Drinks a fifth a week  . Drug use: Yes    Types: Marijuana, Cocaine, IV    Home Medications Prior to Admission medications   Not on File    Allergies    Patient has no known allergies.  Review of Systems   Review of Systems  Constitutional: Positive for fatigue. Negative for chills, diaphoresis and  fever.  Respiratory: Positive for shortness of breath. Negative for cough.   Cardiovascular: Positive for chest pain and leg swelling (bilaterally).  Gastrointestinal: Negative for abdominal pain, nausea and vomiting.  Neurological: Negative for weakness and numbness.       + left arm paresthesias  All other systems reviewed and are negative.   Physical Exam Updated Vital Signs BP (!) 168/100 (BP Location: Right Arm)   Pulse (!) 104   Temp 98.3 F (36.8 C) (Oral)   Resp 20   Ht 5\' 9"  (1.753 m)   Wt 124.7 kg   SpO2 99%   BMI 40.61 kg/m   Physical Exam Vitals and nursing note reviewed.  Constitutional:      Appearance: He is obese. He is not ill-appearing or diaphoretic.  HENT:      Head: Normocephalic and atraumatic.  Eyes:     Conjunctiva/sclera: Conjunctivae normal.  Neck:     Vascular: No carotid bruit.  Cardiovascular:     Rate and Rhythm: Normal rate and regular rhythm.     Pulses:          Radial pulses are 2+ on the right side and 2+ on the left side.       Dorsalis pedis pulses are 2+ on the right side and 2+ on the left side.     Heart sounds: Normal heart sounds.  Pulmonary:     Effort: Pulmonary effort is normal.     Breath sounds: Normal breath sounds. No decreased breath sounds, wheezing or rhonchi.  Chest:     Chest wall: No tenderness.  Abdominal:     Palpations: Abdomen is soft.     Tenderness: There is no abdominal tenderness. There is no guarding or rebound.  Musculoskeletal:     Cervical back: Neck supple.     Comments: Nonpitting edema bilaterally  Skin:    General: Skin is warm and dry.  Neurological:     Mental Status: He is alert.     Comments: CN 3-12 grossly intact A&O x4 GCS 15 Sensation and strength intact Gait nonataxic including with tandem walking Coordination with finger-to-nose WNL Neg romberg, neg pronator drift     ED Results / Procedures / Treatments   Labs (all labs ordered are listed, but only abnormal results are displayed) Labs Reviewed  COMPREHENSIVE METABOLIC PANEL - Abnormal; Notable for the following components:      Result Value   Glucose, Bld 144 (*)    All other components within normal limits  TROPONIN I (HIGH SENSITIVITY) - Abnormal; Notable for the following components:   Troponin I (High Sensitivity) 79 (*)    All other components within normal limits  TROPONIN I (HIGH SENSITIVITY) - Abnormal; Notable for the following components:   Troponin I (High Sensitivity) 78 (*)    All other components within normal limits  SARS CORONAVIRUS 2 (TAT 6-24 HRS)  CBC WITH DIFFERENTIAL/PLATELET  BRAIN NATRIURETIC PEPTIDE  D-DIMER, QUANTITATIVE (NOT AT [facility])  MAGNESIUM  PHOSPHORUS    EKG EKG  Interpretation  Date/Time:  Friday May 23 2019 16:43:21 EST Ventricular Rate:  90 PR Interval:  120 QRS Duration: 88 QT Interval:  378 QTC Calculation: 462 R Axis:   -55 Text Interpretation: Normal sinus rhythm Left anterior fascicular block Septal infarct , age undetermined Cannot rule out Inferior infarct (masked by fascicular block?) , age undetermined ST & T wave abnormality, consider lateral ischemia Abnormal ECG Confirmed by Nat Christen [phone]) on 05/23/2019 4:45:10 PM Also confirmed by  Nat Christen [id number])  on 05/23/2019 4:47:42 PM   Radiology DG Chest 1 View  Result Date: 05/23/2019 CLINICAL DATA:  44 year old male with chest pain, left arm pain and numbness for 2 weeks. EXAM: CHEST  1 VIEW COMPARISON:  Chest radiographs 02/14/2019 and earlier. FINDINGS: Portable AP upright views at 1719 hours. Lung volumes and mediastinal contours remain normal. Visualized tracheal air column is within normal limits. Allowing for portable technique the lungs are clear. No pneumothorax or pleural effusion. Paucity of bowel gas in the upper abdomen. No acute osseous abnormality identified. IMPRESSION: Negative portable chest. Electronically Signed   By: Genevie Ann M.D.   On: 05/23/2019 17:44    Procedures Procedures (including critical care time)  Medications Ordered in ED Medications  metoprolol tartrate (LOPRESSOR) injection 5 mg (has no administration in time range)  aspirin tablet 325 mg (325 mg Oral Given 05/23/19 1819)    ED Course  I have reviewed the triage vital signs and the nursing notes.  Pertinent labs & imaging results that were available during my care of the patient were reviewed by me and considered in my medical decision making (see chart for details).  44 year old male who presents the ED initially complaining of entire left arm paresthesias however does report he has been having intermittent chest pain, shortness of breath, bilateral leg swelling, right hand paresthesia the  past 2 to 3 weeks.  On arrival to the ED patient is afebrile, tachycardic at 104, nontachypneic.  Blood pressure elevated 168/100.  Patient has history of high blood pressure, not on any medications, does not have a PCP.  Per chart review amlodipine is listed for medications however patient states he has not been on this for approximately 1 year.  Recent additions to medicines or changes.  Patient is a current every day smoker and does have family history concerning for CAD.  Given his chest pain and shortness of breath will work-up for ACS at this time.  Patient is tachycardic and therefore I cannot PERC out.  Will obtain D-dimer.  Paresthesias to entire left arm as well as right hand, given bilaterality and onset of symptoms approximately 2 to 3 weeks I am less concern for dissection as I would suspect severe consequences have already emerged.  He has no neck pain, not consistent with cervical radiculopathy.  No focal neuro deficits on exam, doubt stroke at this time.  He does not have any active chest pain currently. 325 mg aspirin given.   EKG with some lateral lead t waves inversions that are increased from previous.   CBC without leukocytosis. Hgb stable.  CMP with elevated glucose 144. No other electrolyte abnormalities. Creatinine stable.  Initial troponin 79; will repeat.  D dimer within normal limits BNP 66.   CXR without any signs of vascular congestion.   During ED visit pt reports he had a couple of seconds of chest pain earlier however when I evaluated patient he reports his chest pain is gone.   Repeat troponin 78. Given EKG changes and mild elevation in troponin will consult cardiology.   Discussed case with Dr. Radford Pax with cardiology who recommends medicine admission. Recommends ECHO in the morning and to touch base with cards if any abnormalities.   Discussed case with Dr. Olevia Bowens triad hospitalist who agrees to accept patient for admission.   This note was prepared using Dragon  voice recognition software and may include unintentional dictation errors due to the inherent limitations of voice recognition software.  Clinical Course as  of May 22 2249  Fri May 23, 2019  1736 Troponin I (High Sensitivity)(!): 72 [MV]    Clinical Course User Index [MV] Eustaquio Maize, PA-C   MDM Rules/Calculators/A&P         HEART Score: 5              Final Clinical Impression(s) / ED Diagnoses Final diagnoses:  Atypical chest pain    Rx / DC Orders ED Discharge Orders    None       Eustaquio Maize, Hershal Coria 05/23/19 2251    Nat Christen, MD 05/24/19 2250

## 2019-05-23 NOTE — H&P (Signed)
History and Physical    Miguel Elliott [id number] DOB: 1975/12/12 DOA: 05/23/2019  PCP: Patient, No Pcp Per   Patient coming from: Home.  I have personally briefly reviewed patient's old medical records in [place]  Chief Complaint: Left arm pain/chest pain  HPI: Miguel Elliott is a 44 y.o. male with medical history significant of anxiety, depression, PTSD, EtOH use, esophageal varices, Mallory-Weiss tear, gastric erosions, GERD, hiatal hernia, portal hypertensive gastropathy, hypertension who has not taken prescription medications in about a year who is coming to the emergency department with complaints of left-sided sharp chest pain associated with left upper extremity tingling/numbness that lasts a few seconds and happens a few times a day.  No nausea, emesis, dizziness or lightheadedness, but the patient endorses having diaphoresis and one of the episodes.  This is not associated specifically with exertion. He had lower extremity edema recently, but this has subsided.  Denies PND, orthopnea or palpitations.  No fever, chills, wheezing, productive cough, hemoptysis, abdominal pain, diarrhea, constipation, melena or hematochezia.  No obese Denies dysuria, frequency or hematuria.  No polyuria, polydipsia, polyphagia or blurred vision.  He mentions that he sometimes uses cocaine and last used 3 days ago.  He still drinks alcohol, maybe once or twice a week.  ED Course: Initial vital signs temperature 98.3 F, pulse 104, respirations 20, blood pressure 168/100 mmHg and O2 sat 99% on room air.  The patient was given 325 mg of aspirin p.o.  EKG shows more pronounced T wave inversions on anterolateral leads.  BNP was 66.0 pg/mL.  Troponin was 79 and then 78 ng/L.  CBC and D-dimer were normal.  His CMP shows a nonfasting blood glucose of 144 mg/dL, but no other abnormal finding.  Portable chest radiograph is read as negative.  Review of Systems: As per HPI otherwise 10 point review of systems  negative.   Past Medical History:  Diagnosis Date  . Abnormal weight loss    30 pounds since 11/2010  . Anxiety   . Depression    no medication  . Esophageal varices (HCC) 12/22/10    grade 1 egd by Dr. Gala Romney  . Gastric erosions 12/22/10    egd by Dr. Gala Romney  . GERD (gastroesophageal reflux disease)   . Hiatal hernia 12/22/10    egd by Dr. Gala Romney  . Hypertension   . 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 Gala Romney  . Portal hypertensive gastropathy   . PTSD (post-traumatic stress disorder)     Past Surgical History:  Procedure Laterality Date  . ESOPHAGOGASTRODUODENOSCOPY  12/22/2010   Procedure: ESOPHAGOGASTRODUODENOSCOPY (EGD);  Surgeon: Daneil Dolin, MD;  Location[facility];  Service: Endoscopy;  Laterality: N/A;.    ERE, M-W tear, gastric erosions, bx benign, minimal esophageal variecs, portal gastropathy  . FOOT SURGERY    . NASAL RECONSTRUCTION  2003     reports that he has been smoking cigarettes. He has been smoking about 0.25 packs per day. He has never used smokeless tobacco. He reports current alcohol use of about 15.0 standard drinks of alcohol per week. He reports current drug use. Drugs: Marijuana, Cocaine, and IV.  No Known Allergies  Family History  Problem Relation Age of Onset  . Hypertension Father   . Diabetes Father   . Hypertension Paternal Grandmother   . Diabetes Maternal Grandmother   . Colon cancer Neg Hx   . Ulcers Neg Hx    Prior to Admission medications  Not on File    Physical Exam: Vitals:   05/23/19 1443 05/23/19 2000 05/23/19 2100 05/23/19 2301  BP:   (!) 179/97 (!) 167/102  Pulse:      Resp:  20 17 13   Temp:      TempSrc:      SpO2:      Weight: 124.7 kg     Height: 5\' 9"  (1.753 m)       Constitutional: NAD, calm, comfortable Eyes: PERRL, lids and conjunctivae normal ENMT: Mucous membranes are moist. Posterior pharynx clear of any exudate or lesions. Neck: normal, supple, no masses, no thyromegaly Respiratory: clear to  auscultation bilaterally, no wheezing, no crackles. Normal respiratory effort. No accessory muscle use.  Cardiovascular: Regular rate and rhythm, no murmurs / rubs / gallops. No extremity edema. 2+ pedal pulses. No carotid bruits.  Abdomen: Obese, no tenderness, no masses palpated. No hepatosplenomegaly. Bowel sounds positive.  Musculoskeletal: no clubbing / cyanosis. . Good ROM, no contractures. Normal muscle tone.  Skin: no rashes, lesions, ulcers. No induration Neurologic: CN 2-12 grossly intact. Sensation intact, DTR normal. Strength 5/5 in all 4.  Psychiatric: Normal judgment and insight. Alert and oriented x 3. Normal mood.   Labs on Admission: I have personally reviewed following labs and imaging studies  CBC: Recent Labs  Lab 05/23/19 1624  WBC 7.2  NEUTROABS 4.6  HGB 16.7  HCT 51.8  MCV 94.4  PLT 99991111   Basic Metabolic Panel: Recent Labs  Lab 05/23/19 1624 05/23/19 1906  NA 139  --   K 3.7  --   CL 106  --   CO2 24  --   GLUCOSE 144*  --   BUN 16  --   CREATININE 1.05  --   CALCIUM 9.2  --   MG  --  2.3  PHOS  --  2.9   GFR: Estimated Creatinine Clearance: 118.4 mL/min (by C-G formula based on SCr of 1.05 mg/dL). Liver Function Tests: Recent Labs  Lab 05/23/19 1624  AST 21  ALT 22  ALKPHOS 60  BILITOT 0.9  PROT 7.6  ALBUMIN 4.2   No results for input(s): LIPASE, AMYLASE in the last 168 hours. No results for input(s): AMMONIA in the last 168 hours. Coagulation Profile: No results for input(s): INR, PROTIME in the last 168 hours. Cardiac Enzymes: No results for input(s): CKTOTAL, CKMB, CKMBINDEX, TROPONINI in the last 168 hours. BNP (last 3 results) No results for input(s): PROBNP in the last 8760 hours. HbA1C: No results for input(s): HGBA1C in the last 72 hours. CBG: No results for input(s): GLUCAP in the last 168 hours. Lipid Profile: No results for input(s): CHOL, HDL, LDLCALC, TRIG, CHOLHDL, LDLDIRECT in the last 72 hours. Thyroid Function  Tests: No results for input(s): TSH, T4TOTAL, FREET4, T3FREE, THYROIDAB in the last 72 hours. Anemia Panel: No results for input(s): VITAMINB12, FOLATE, FERRITIN, TIBC, IRON, RETICCTPCT in the last 72 hours. Urine analysis:    Component Value Date/Time   COLORURINE YELLOW 02/14/2019 1402   APPEARANCEUR CLEAR 02/14/2019 1402   LABSPEC 1.023 02/14/2019 1402   PHURINE 5.0 02/14/2019 1402   GLUCOSEU NEGATIVE 02/14/2019 1402   HGBUR SMALL (A) 02/14/2019 1402   BILIRUBINUR NEGATIVE 02/14/2019 1402   KETONESUR 5 (A) 02/14/2019 1402   PROTEINUR NEGATIVE 02/14/2019 1402   UROBILINOGEN 1.0 12/23/2010 2230   NITRITE NEGATIVE 02/14/2019 1402   LEUKOCYTESUR NEGATIVE 02/14/2019 1402    Radiological Exams on Admission: DG Chest 1 View  Result Date: 05/23/2019 CLINICAL  DATA:  44 year old male with chest pain, left arm pain and numbness for 2 weeks. EXAM: CHEST  1 VIEW COMPARISON:  Chest radiographs 02/14/2019 and earlier. FINDINGS: Portable AP upright views at 1719 hours. Lung volumes and mediastinal contours remain normal. Visualized tracheal air column is within normal limits. Allowing for portable technique the lungs are clear. No pneumothorax or pleural effusion. Paucity of bowel gas in the upper abdomen. No acute osseous abnormality identified. IMPRESSION: Negative portable chest. Electronically Signed   By: Genevie Ann M.D.   On: 05/23/2019 17:44    EKG: Independently reviewed.  Vent. rate 90 BPM PR interval 120 ms QRS duration 88 ms QT/QTc 378/462 ms P-R-T axes -13 -55 45 Normal sinus rhythm Left anterior fascicular block Septal infarct , age undetermined Cannot rule out Inferior infarct (masked by fascicular block?) , age undetermined ST & T wave abnormality, consider lateral ischemia  Assessment/Plan Principal Problem:   Chest pain Observation/telemetry. Aspirin 162 mg p.o. daily. Serial EKG. Repeat troponin in a.m. Echocardiogram in a.m. If abnormal, reconsult cardiology in  a.m.  Active Problems:   Hypertension Gave metoprolol earlier. However, patient uses cocaine occasionally. His last use was on Tuesday. Monitor blood pressure.    GERD (gastroesophageal reflux disease) Start Protonix 40 mg p.o. daily.    Anxiety with depression No SI or HI. Advised establishing with PCP. Advised counseling. Advised recreational drug use cessation.    Polysubstance abuse [facility]) Consult transitional care team. Consult [facility]. Nicotine replacement therapy.    Hyperglycemia Check fasting glucose and hemoglobin A1c.    Morbid obesity ([place]) Advised weight loss. Advised to establish with PCP.     DVT prophylaxis: SCDs. Code Status: Full code. Family Communication: Disposition Plan: Observation chest pain/echo in a.m. Consults called: Admission status: Observation/telemetry.   Reubin Milan MD [facility]  If 7PM-7AM, please contact night-coverage [email or web address]  05/23/2019, 11:37 PM   This document was prepared using Dragon voice recognition software and may contain some unintended transcription errors.

## 2019-05-23 NOTE — ED Triage Notes (Signed)
Patient complains of left arm pain and numbness that began two weeks ago. Patient also complains of bilateral lower leg swelling.

## 2019-05-23 NOTE — ED Notes (Signed)
Pt ambulatory to vending machine to get snack.

## 2019-05-23 NOTE — ED Notes (Signed)
Spoke with [facility] about Cardiology, not able to reach Dr at this time

## 2019-05-24 ENCOUNTER — Observation Stay (HOSPITAL_BASED_OUTPATIENT_CLINIC_OR_DEPARTMENT_OTHER): Payer: BC Managed Care – PPO

## 2019-05-24 DIAGNOSIS — K219 Gastro-esophageal reflux disease without esophagitis: Secondary | ICD-10-CM | POA: Diagnosis not present

## 2019-05-24 DIAGNOSIS — R079 Chest pain, unspecified: Secondary | ICD-10-CM

## 2019-05-24 DIAGNOSIS — R739 Hyperglycemia, unspecified: Secondary | ICD-10-CM | POA: Diagnosis not present

## 2019-05-24 DIAGNOSIS — E119 Type 2 diabetes mellitus without complications: Secondary | ICD-10-CM

## 2019-05-24 DIAGNOSIS — F1910 Other psychoactive substance abuse, uncomplicated: Secondary | ICD-10-CM | POA: Diagnosis present

## 2019-05-24 LAB — COMPREHENSIVE METABOLIC PANEL
ALT: 17 U/L (ref 0–44)
AST: 17 U/L (ref 15–41)
Albumin: 3.6 g/dL (ref 3.5–5.0)
Alkaline Phosphatase: 59 U/L (ref 38–126)
Anion gap: 8 (ref 5–15)
BUN: 15 mg/dL (ref 6–20)
CO2: 26 mmol/L (ref 22–32)
Calcium: 8.4 mg/dL — ABNORMAL LOW (ref 8.9–10.3)
Chloride: 104 mmol/L (ref 98–111)
Creatinine, Ser: 1.07 mg/dL (ref 0.61–1.24)
GFR calc Af Amer: >60 mL/min (ref >60)
GFR calc non Af Amer: >60 mL/min (ref >60)
Glucose, Bld: 193 mg/dL — ABNORMAL HIGH (ref 70–99)
Potassium: 3.7 mmol/L (ref 3.5–5.1)
Sodium: 138 mmol/L (ref 135–145)
Total Bilirubin: 0.7 mg/dL (ref 0.3–1.2)
Total Protein: 6.7 g/dL (ref 6.5–8.1)

## 2019-05-24 LAB — RAPID URINE DRUG SCREEN, HOSP PERFORMED
Amphetamines: NOT DETECTED
Barbiturates: NOT DETECTED
Benzodiazepines: NOT DETECTED
Cocaine: POSITIVE — AB
Opiates: NOT DETECTED
Tetrahydrocannabinol: NOT DETECTED

## 2019-05-24 LAB — HEMOGLOBIN A1C
Hgb A1c MFr Bld: 6.7 % — ABNORMAL HIGH (ref 4.8–5.6)
Mean Plasma Glucose: 145.59 mg/dL

## 2019-05-24 LAB — ECHOCARDIOGRAM COMPLETE
Height: 69 in
Weight: 4400 oz

## 2019-05-24 LAB — LIPID PANEL
Cholesterol: 143 mg/dL (ref 0–200)
HDL: 39 mg/dL — ABNORMAL LOW (ref >40)
LDL Cholesterol: 80 mg/dL (ref 0–99)
Total CHOL/HDL Ratio: 3.7 RATIO
Triglycerides: 119 mg/dL (ref <150)
VLDL: 24 mg/dL (ref 0–40)

## 2019-05-24 LAB — SARS CORONAVIRUS 2 (TAT 6-24 HRS): SARS Coronavirus 2: NEGATIVE

## 2019-05-24 LAB — TROPONIN I (HIGH SENSITIVITY): Troponin I (High Sensitivity): 69 ng/L — ABNORMAL HIGH (ref <18)

## 2019-05-24 MED ORDER — ACETAMINOPHEN 325 MG PO TABS: 650.0000 mg | ORAL_TABLET | Freq: Four times a day (QID) | ORAL | Status: DC | PRN

## 2019-05-24 MED ORDER — NICOTINE 21 MG/24HR TD PT24: 21.0000 mg | MEDICATED_PATCH | Freq: Every day | TRANSDERMAL | 0 refills | Status: DC | PRN

## 2019-05-24 MED ORDER — METFORMIN HCL ER 500 MG PO TB24: 500.0000 mg | ORAL_TABLET | Freq: Two times a day (BID) | ORAL | 1 refills | Status: DC

## 2019-05-24 MED ORDER — PANTOPRAZOLE SODIUM 40 MG PO TBEC: 40.0000 mg | DELAYED_RELEASE_TABLET | Freq: Every day | ORAL | 1 refills | Status: DC

## 2019-05-24 MED ORDER — METOPROLOL TARTRATE 25 MG PO TABS: 25.0000 mg | ORAL_TABLET | Freq: Two times a day (BID) | ORAL | 1 refills | Status: DC

## 2019-05-24 MED ORDER — ATORVASTATIN CALCIUM 10 MG PO TABS: 10.0000 mg | ORAL_TABLET | Freq: Every day | ORAL | 0 refills | Status: DC

## 2019-05-24 MED ORDER — PANTOPRAZOLE SODIUM 40 MG PO TBEC
40.0000 mg | DELAYED_RELEASE_TABLET | Freq: Every day | ORAL | Status: DC
  Administered 2019-05-24: 09:00:00 40 mg via ORAL
  Filled 2019-05-24: qty 1

## 2019-05-24 MED ORDER — LISINOPRIL 2.5 MG PO TABS: 2.5000 mg | ORAL_TABLET | Freq: Every day | ORAL | 0 refills | Status: DC

## 2019-05-24 MED ORDER — LISINOPRIL 20 MG PO TABS: 20.0000 mg | ORAL_TABLET | Freq: Every day | ORAL | 0 refills | Status: DC

## 2019-05-24 MED ORDER — ASPIRIN 81 MG PO TBEC: 81.0000 mg | DELAYED_RELEASE_TABLET | Freq: Every day | ORAL | Status: DC

## 2019-05-24 MED ORDER — ATORVASTATIN CALCIUM 20 MG PO TABS: 20.0000 mg | ORAL_TABLET | Freq: Every day | ORAL | 1 refills | Status: DC

## 2019-05-24 MED ORDER — NICOTINE 21 MG/24HR TD PT24
21.0000 mg | MEDICATED_PATCH | Freq: Every day | TRANSDERMAL | Status: DC
  Administered 2019-05-24: 21 mg via TRANSDERMAL
  Filled 2019-05-24: qty 1

## 2019-05-24 MED ORDER — BLOOD GLUCOSE METER KIT: PACK | 0 refills | Status: DC

## 2019-05-24 MED ORDER — METOPROLOL TARTRATE 25 MG PO TABS
25.0000 mg | ORAL_TABLET | Freq: Two times a day (BID) | ORAL | Status: DC
  Administered 2019-05-24: 25 mg via ORAL
  Filled 2019-05-24: qty 1

## 2019-05-24 NOTE — Progress Notes (Signed)
*  PRELIMINARY RESULTS* Echocardiogram 2D Echocardiogram has been performed.  Miguel Elliott 05/24/2019, 1:32 PM

## 2019-05-24 NOTE — Discharge Summary (Addendum)
Physician Discharge Summary  Miguel Elliott RWE:[id number] DOB: Jun 08, 1975 DOA: 05/23/2019  Admit date: 05/23/2019 Discharge date: 05/24/2019  Admitted From:  Home  Disposition: Home   Recommendations for Outpatient Follow-up:  1. Establish care with PCP in 1-2 weeks 2. Establish care with [facility] in 2 weeks 3. Please obtain BMP/CBC in 1-2 weeks to check electrolytes and hemoglobin 4. Please follow up on the following pending results: final 2D echocardiogram report  Discharge Condition: STABLE   CODE STATUS: FULL    Brief Hospitalization Summary: Please see all hospital notes, images, labs for full details of the hospitalization. ADMISSION HPI: Miguel Elliott is a 44 y.o. male with medical history significant of anxiety, depression, PTSD, EtOH use, esophageal varices, Mallory-Weiss tear, gastric erosions, GERD, hiatal hernia, portal hypertensive gastropathy, hypertension who has not taken prescription medications in about a year who is coming to the emergency department with complaints of left-sided sharp chest pain associated with left upper extremity tingling/numbness that lasts a few seconds and happens a few times a day.  No nausea, emesis, dizziness or lightheadedness, but the patient endorses having diaphoresis and one of the episodes.  This is not associated specifically with exertion. He had lower extremity edema recently, but this has subsided.  Denies PND, orthopnea or palpitations.  No fever, chills, wheezing, productive cough, hemoptysis, abdominal pain, diarrhea, constipation, melena or hematochezia.  No obese Denies dysuria, frequency or hematuria.  No polyuria, polydipsia, polyphagia or blurred vision.  He mentions that he sometimes uses cocaine and last used 3 days ago.  He still drinks alcohol, maybe once or twice a week.  ED Course: Initial vital signs temperature 98.3 F, pulse 104, respirations 20, blood pressure 168/100 mmHg and O2 sat 99% on room air.  The  patient was given 325 mg of aspirin p.o.  EKG shows more pronounced T wave inversions on anterolateral leads.  BNP was 66.0 pg/mL.  Troponin was 79 and then 78 ng/L.  CBC and D-dimer were normal.  His CMP shows a nonfasting blood glucose of 144 mg/dL, but no other abnormal finding.  Portable chest radiograph is read as negative.  The patient was admitted with chest pain and hypertensive urgency.  He had not been taking his pressure medications for very long time and has not seen a primary care physician.  He has been admitting to using cocaine on occasion.  He has a history of acid reflux gastritis and has not followed up with GI in several years.  He was started on Protonix for his GI symptoms.  His high-sensitivity troponin tests have been negative and ruled out ACS.  His hemoglobin A1c was elevated at 6.7% which is diagnostic for type 2 diabetes mellitus.  He has been started on Metformin extended release 500 mg twice daily with meals.  A referral to a PCP has been made a referral to follow-up with GI and to start diabetes education and nutrition has been requested.  A 2D echocardiogram has been ordered but is pending the final results at discharge.  He should follow up these final results when he sees cardiology in primary care provider.  He was given a referral to follow-up with cardiology.  He was started on lisinopril 20 mg daily for blood pressure control.  Patient was also given prescription for testing supplies.  He was seen by behavioral health and psych cleared.  He is stable to discharge home with close outpatient follow up.  I consulted TOC to make sure he has  PCP follow up with a PCP that is accepting new patients.  Pt advised to stop cocaine and recreational drugs.   2D echocardiogram 05/24/19 IMPRESSIONS  1. Left ventricular ejection fraction, by estimation, is 50 to 55%. The  left ventricle has low normal function. The left ventricle has no regional  wall motion abnormalities. There is  moderate left ventricular hypertrophy.  Left ventricular diastolic  parameters are consistent with Grade I diastolic dysfunction (impaired  relaxation).  2. Right ventricular systolic function is normal. The right ventricular  size is normal. There is normal pulmonary artery systolic pressure. The  estimated right ventricular systolic pressure is 78.6 mmHg.  3. The mitral valve is grossly normal. Trivial mitral valve  regurgitation.  4. The aortic valve is tricuspid. Aortic valve regurgitation is not  visualized.  5. The inferior vena cava is normal in size with greater than 50%  respiratory variability, suggesting right atrial pressure of 3 mmHg.   Discharge Diagnoses:  Principal Problem:   Chest pain Active Problems:   Hypertension   GERD (gastroesophageal reflux disease)   Anxiety with depression   Hyperglycemia   Morbid obesity (HCC)   Polysubstance abuse ([place])   Type 2 diabetes mellitus [facility])   Discharge Instructions: Discharge Instructions    Ambulatory referral to Cardiology   Complete by: As directed    Ambulatory referral to Nutrition and Diabetic Education   Complete by: As directed    Diet - low sodium heart healthy   Complete by: As directed    Increase activity slowly   Complete by: As directed      Allergies as of 05/24/2019   No Known Allergies     Medication List    TAKE these medications   acetaminophen 325 MG tablet Commonly known as: TYLENOL Take 2 tablets (650 mg total) by mouth every 6 (six) hours as needed for mild pain (or Fever >/= 101).   aspirin 81 MG EC tablet Take 1 tablet (81 mg total) by mouth daily. Start taking on: May 25, 2019   atorvastatin 10 MG tablet Commonly known as: LIPITOR Take 1 tablet (10 mg total) by mouth daily at 6 PM.   blood glucose meter kit and supplies Dispense based on patient and insurance preference. Use up to four times daily as directed. (FOR ICD-10 E10.9, E11.9).   lisinopril 20 MG  tablet Commonly known as: Zestril Take 1 tablet (20 mg total) by mouth daily.   metFORMIN 500 MG 24 hr tablet Commonly known as: Glucophage XR Take 1 tablet (500 mg total) by mouth 2 (two) times daily with a meal.   nicotine 21 mg/24hr patch Commonly known as: NICODERM CQ - dosed in mg/24 hours Place 1 patch (21 mg total) onto the skin daily as needed.   pantoprazole 40 MG tablet Commonly known as: PROTONIX Take 1 tablet (40 mg total) by mouth daily. Start taking on: May 25, 2019      Follow-up Information    Celene Squibb, MD. Call.   Specialty: Internal Medicine Why:  Call  Monday for Hospital Follow up appointment  Contact information: [address] Quintella Reichert [facility] [address] [place]. Schedule an appointment as soon as possible for a visit in 1 week(s).   Specialty: Cardiology Why: Establish care for chest pain evaluation Contact information: [address] [place]. Schedule an appointment as  soon as possible for a visit in 2 week(s).   Why: Hospital Follow Up  Contact information: [address] [place] [phone]         No Known Allergies Allergies as of 05/24/2019   No Known Allergies     Medication List    TAKE these medications   acetaminophen 325 MG tablet Commonly known as: TYLENOL Take 2 tablets (650 mg total) by mouth every 6 (six) hours as needed for mild pain (or Fever >/= 101).   aspirin 81 MG EC tablet Take 1 tablet (81 mg total) by mouth daily. Start taking on: May 25, 2019   atorvastatin 10 MG tablet Commonly known as: LIPITOR Take 1 tablet (10 mg total) by mouth daily at 6 PM.   blood glucose meter kit and supplies Dispense based on patient and insurance preference. Use up to four times daily as directed. (FOR ICD-10 E10.9, E11.9).   lisinopril 20 MG tablet Commonly known  as: Zestril Take 1 tablet (20 mg total) by mouth daily.   metFORMIN 500 MG 24 hr tablet Commonly known as: Glucophage XR Take 1 tablet (500 mg total) by mouth 2 (two) times daily with a meal.   nicotine 21 mg/24hr patch Commonly known as: NICODERM CQ - dosed in mg/24 hours Place 1 patch (21 mg total) onto the skin daily as needed.   pantoprazole 40 MG tablet Commonly known as: PROTONIX Take 1 tablet (40 mg total) by mouth daily. Start taking on: May 25, 2019       Procedures/Studies: DG Chest 1 View  Result Date: 05/23/2019 CLINICAL DATA:  44 year old male with chest pain, left arm pain and numbness for 2 weeks. EXAM: CHEST  1 VIEW COMPARISON:  Chest radiographs 02/14/2019 and earlier. FINDINGS: Portable AP upright views at 1719 hours. Lung volumes and mediastinal contours remain normal. Visualized tracheal air column is within normal limits. Allowing for portable technique the lungs are clear. No pneumothorax or pleural effusion. Paucity of bowel gas in the upper abdomen. No acute osseous abnormality identified. IMPRESSION: Negative portable chest. Electronically Signed   By: Genevie Ann M.D.   On: 05/23/2019 17:44     Subjective: Pt says his chest pain has resolved. He was seen by behavioral health and psych cleared.  He has no other complaints.   Discharge Exam: Vitals:   05/24/19 0630 05/24/19 1000  BP: 138/77 (!) 145/81  Pulse: 71 74  Resp: 16   Temp: (!) 97.5 F (36.4 C)   SpO2: 96%    Vitals:   05/24/19 0003 05/24/19 0035 05/24/19 0630 05/24/19 1000  BP: (!) 160/90 (!) 199/102 138/77 (!) 145/81  Pulse: 80 83 71 74  Resp:  20 16   Temp:  98.2 F (36.8 C) (!) 97.5 F (36.4 C)   TempSrc:  Oral Oral   SpO2:  100% 96%   Weight:      Height:       General: Pt is alert, awake, not in acute distress Cardiovascular: RRR, S1/S2 +, no rubs, no gallops Respiratory: CTA bilaterally, no wheezing, no rhonchi Abdominal: Soft, NT, ND, bowel sounds + Extremities: no  edema, no cyanosis Psych: flat affect noticed.    The results of significant diagnostics from this hospitalization (including imaging, microbiology, ancillary and laboratory) are listed below for reference.    Microbiology: No results found for this or any previous visit (from the past 240 hour(s)).   Labs: BNP (last 3 results) Recent Labs  05/23/19 1625  BNP 01.4   Basic Metabolic Panel: Recent Labs  Lab 05/23/19 1624 05/23/19 1906 05/24/19 0617  NA 139  --  138  K 3.7  --  3.7  CL 106  --  104  CO2 24  --  26  GLUCOSE 144*  --  193*  BUN 16  --  15  CREATININE 1.05  --  1.07  CALCIUM 9.2  --  8.4*  MG  --  2.3  --   PHOS  --  2.9  --    Liver Function Tests: Recent Labs  Lab 05/23/19 1624 05/24/19 0617  AST 21 17  ALT 22 17  ALKPHOS 60 59  BILITOT 0.9 0.7  PROT 7.6 6.7  ALBUMIN 4.2 3.6   No results for input(s): LIPASE, AMYLASE in the last 168 hours. No results for input(s): AMMONIA in the last 168 hours. CBC: Recent Labs  Lab 05/23/19 1624  WBC 7.2  NEUTROABS 4.6  HGB 16.7  HCT 51.8  MCV 94.4  PLT 206   Cardiac Enzymes: No results for input(s): CKTOTAL, CKMB, CKMBINDEX, TROPONINI in the last 168 hours. BNP: Invalid input(s): POCBNP CBG: No results for input(s): GLUCAP in the last 168 hours. D-Dimer Recent Labs    05/23/19 1619  DDIMER 0.32   Hgb A1c Recent Labs    05/24/19 0617  HGBA1C 6.7*   Lipid Profile Recent Labs    05/24/19 0617  CHOL 143  HDL 39*  LDLCALC 80  TRIG 119  CHOLHDL 3.7   Thyroid function studies No results for input(s): TSH, T4TOTAL, T3FREE, THYROIDAB in the last 72 hours.  Invalid input(s): FREET3 Anemia work up No results for input(s): VITAMINB12, FOLATE, FERRITIN, TIBC, IRON, RETICCTPCT in the last 72 hours. Urinalysis    Component Value Date/Time   COLORURINE YELLOW 02/14/2019 1402   APPEARANCEUR CLEAR 02/14/2019 1402   LABSPEC 1.023 02/14/2019 1402   PHURINE 5.0 02/14/2019 1402   GLUCOSEU  NEGATIVE 02/14/2019 1402   HGBUR SMALL (A) 02/14/2019 1402   BILIRUBINUR NEGATIVE 02/14/2019 1402   KETONESUR 5 (A) 02/14/2019 1402   PROTEINUR NEGATIVE 02/14/2019 1402   UROBILINOGEN 1.0 12/23/2010 2230   NITRITE NEGATIVE 02/14/2019 1402   LEUKOCYTESUR NEGATIVE 02/14/2019 1402   Sepsis Labs Invalid input(s): PROCALCITONIN,  WBC,  LACTICIDVEN Microbiology No results found for this or any previous visit (from the past 240 hour(s)).  Time coordinating discharge:  SIGNED:  Irwin Brakeman, MD  [facility] 05/24/2019, 2:03 PM How to contact the [facility] Attending or Consulting provider [place] or covering provider during after hours [place], for this patient?  1. Check the care team in [facility] and look for a) attending/consulting TR[facility] provider listed and b) the [facility] team listed 2. Log into [email or web address] and use [facility]'s universal password to access. If you do not have the password, please contact the hospital operator. 3. Locate the [facility] provider you are looking for under [facility] and page to a number that you can be directly reached. 4. If you still have difficulty reaching the provider, please page the [facility] (Director on Call) for the Hospitalists listed on amion for assistance.

## 2019-05-24 NOTE — Discharge Instructions (Signed)
Type 2 Diabetes Mellitus, Diagnosis, Adult Type 2 diabetes (type 2 diabetes mellitus) is a long-term (chronic) disease. In type 2 diabetes, one or both of these problems may be present:  The pancreas does not make enough of a hormone called insulin.  Cells in the body do not respond properly to insulin that the body makes (insulin resistance). Normally, insulin allows blood sugar (glucose) to enter cells in the body. The cells use glucose for energy. Insulin resistance or lack of insulin causes excess glucose to build up in the blood instead of going into cells. As a result, high blood glucose (hyperglycemia) develops. What increases the risk? The following factors may make you more likely to develop type 2 diabetes:  Having a family member with type 2 diabetes.  Being overweight or obese.  Having an inactive (sedentary) lifestyle.  Having been diagnosed with insulin resistance.  Having a history of prediabetes, gestational diabetes, or polycystic ovary syndrome (PCOS).  Being of American-Indian, African-American, Hispanic/Latino, or Asian/Pacific Islander descent. What are the signs or symptoms? In the early stage of this condition, you may not have symptoms. Symptoms develop slowly and may include:  Increased thirst (polydipsia).  Increased hunger(polyphagia).  Increased urination (polyuria).  Increased urination during the night (nocturia).  Unexplained weight loss.  Frequent infections that keep coming back (recurring).  Fatigue.  Weakness.  Vision changes, such as blurry vision.  Cuts or bruises that are slow to heal.  Tingling or numbness in the hands or feet.  Dark patches on the skin (acanthosis nigricans). How is this diagnosed? This condition is diagnosed based on your symptoms, your medical history, a physical exam, and your blood glucose level. Your blood glucose may be checked with one or more of the following blood tests:  A fasting blood glucose (FBG)  test. You will not be allowed to eat (you will fast) for 8 hours or longer before a blood sample is taken.  A random blood glucose test. This test checks blood glucose at any time of day regardless of when you ate.  An A1c (hemoglobin A1c) blood test. This test provides information about blood glucose control over the previous 2-3 months.  An oral glucose tolerance test (OGTT). This test measures your blood glucose at two times: ? After fasting. This is your baseline blood glucose level. ? Two hours after drinking a beverage that contains glucose. You may be diagnosed with type 2 diabetes if:  Your FBG level is 126 mg/dL (7.0 mmol/L) or higher.  Your random blood glucose level is 200 mg/dL (11.1 mmol/L) or higher.  Your A1c level is 6.5% or higher.  Your OGTT result is higher than 200 mg/dL (11.1 mmol/L). These blood tests may be repeated to confirm your diagnosis. How is this treated? Your treatment may be managed by a specialist called an endocrinologist. Type 2 diabetes may be treated by following instructions from your health care provider about:  Making diet and lifestyle changes. This may include: ? Following an individualized nutrition plan that is developed by a diet and nutrition specialist (registered dietitian). ? Exercising regularly. ? Finding ways to manage stress.  Checking your blood glucose level as often as told.  Taking diabetes medicines or insulin daily. This helps to keep your blood glucose levels in the healthy range. ? If you use insulin, you may need to adjust the dosage depending on how physically active you are and what foods you eat. Your health care provider will tell you how to adjust your dosage.    Taking medicines to help prevent complications from diabetes, such as: ? Aspirin. ? Medicine to lower cholesterol. ? Medicine to control blood pressure. Your health care provider will set individualized treatment goals for you. Your goals will be based on  your age, other medical conditions you have, and how you respond to diabetes treatment. Generally, the goal of treatment is to maintain the following blood glucose levels:  Before meals (preprandial): 80-130 mg/dL (4.4-7.2 mmol/L).  After meals (postprandial): below 180 mg/dL (10 mmol/L).  A1c level: less than 7%. Follow these instructions at home: Questions to ask your health care provider  Consider asking the following questions: ? Do I need to meet with a diabetes educator? ? Where can I find a support group for people with diabetes? ? What equipment will I need to manage my diabetes at home? ? What diabetes medicines do I need, and when should I take them? ? How often do I need to check my blood glucose? ? What number can I call if I have questions? ? When is my next appointment? General instructions  Take over-the-counter and prescription medicines only as told by your health care provider.  Keep all follow-up visits as told by your health care provider. This is important.  For more information about diabetes, visit: ? American Diabetes Association (ADA): www.diabetes.org ? American Association of Diabetes Educators (AADE): www.diabeteseducator.org Contact a health care provider if:  Your blood glucose is at or above 240 mg/dL (13.3 mmol/L) for 2 days in a row.  You have been sick or have had a fever for 2 days or longer, and you are not getting better.  You have any of the following problems for more than 6 hours: ? You cannot eat or drink. ? You have nausea and vomiting. ? You have diarrhea. Get help right away if:  Your blood glucose is lower than 54 mg/dL (3.0 mmol/L).  You become confused or you have trouble thinking clearly.  You have difficulty breathing.  You have moderate or large ketone levels in your urine. Summary  Type 2 diabetes (type 2 diabetes mellitus) is a long-term (chronic) disease. In type 2 diabetes, the pancreas does not make enough of a  hormone called insulin, or cells in the body do not respond properly to insulin that the body makes (insulin resistance).  This condition is treated by making diet and lifestyle changes and taking diabetes medicines or insulin.  Your health care provider will set individualized treatment goals for you. Your goals will be based on your age, other medical conditions you have, and how you respond to diabetes treatment.  Keep all follow-up visits as told by your health care provider. This is important. This information is not intended to replace advice given to you by your health care provider. Make sure you discuss any questions you have with your health care provider. Document Revised: 05/18/2017 Document Reviewed: 04/23/2015 Elsevier Patient Education  [place].   Nonspecific Chest Pain Chest pain can be caused by many different conditions. Some causes of chest pain can be life-threatening. These will require treatment right away. Serious causes of chest pain include:  Heart attack.  A tear in the body's main blood vessel.  Redness and swelling (inflammation) around your heart.  Blood clot in your lungs. Other causes of chest pain may not be so serious. These include:  Heartburn.  Anxiety or stress.  Damage to bones or muscles in your chest.  Lung infections. Chest pain can feel like:  Pain or discomfort in your chest.  Crushing, pressure, aching, or squeezing pain.  Burning or tingling.  Dull or sharp pain that is worse when you move, cough, or take a deep breath.  Pain or discomfort that is also felt in your back, neck, jaw, shoulder, or arm, or pain that spreads to any of these areas. It is hard to know whether your pain is caused by something that is serious or something that is not so serious. So it is important to see your doctor right away if you have chest pain. Follow these instructions at home: Medicines  Take over-the-counter and prescription  medicines only as told by your doctor.  If you were prescribed an antibiotic medicine, take it as told by your doctor. Do not stop taking the antibiotic even if you start to feel better. Lifestyle   Rest as told by your doctor.  Do not use any products that contain nicotine or tobacco, such as cigarettes, e-cigarettes, and chewing tobacco. If you need help quitting, ask your doctor.  Do not drink alcohol.  Make lifestyle changes as told by your doctor. These may include: ? Getting regular exercise. Ask your doctor what activities are safe for you. ? Eating a heart-healthy diet. A diet and nutrition specialist (dietitian) can help you to learn healthy eating options. ? Staying at a healthy weight. ? Treating diabetes or high blood pressure, if needed. ? Lowering your stress. Activities such as yoga and relaxation techniques can help. General instructions  Pay attention to any changes in your symptoms. Tell your doctor about them or any new symptoms.  Avoid any activities that cause chest pain.  Keep all follow-up visits as told by your doctor. This is important. You may need more testing if your chest pain does not go away. Contact a doctor if:  Your chest pain does not go away.  You feel depressed.  You have a fever. Get help right away if:  Your chest pain is worse.  You have a cough that gets worse, or you cough up blood.  You have very bad (severe) pain in your belly (abdomen).  You pass out (faint).  You have either of these for no clear reason: ? Sudden chest discomfort. ? Sudden discomfort in your arms, back, neck, or jaw.  You have shortness of breath at any time.  You suddenly start to sweat, or your skin gets clammy.  You feel sick to your stomach (nauseous).  You throw up (vomit).  You suddenly feel lightheaded or dizzy.  You feel very weak or tired.  Your heart starts to beat fast, or it feels like it is skipping beats. These symptoms may be an  emergency. Do not wait to see if the symptoms will go away. Get medical help right away. Call your local emergency services (911 in the [place] Do not drive yourself to the hospital. Summary  Chest pain can be caused by many different conditions. The cause may be serious and need treatment right away. If you have chest pain, see your doctor right away.  Follow your doctor's instructions for taking medicines and making lifestyle changes.  Keep all follow-up visits as told by your doctor. This includes visits for any further testing if your chest pain does not go away.  Be sure to know the signs that show that your condition has become worse. Get help right away if you have these symptoms. This information is not intended to replace advice given to you by your  health care provider. Make sure you discuss any questions you have with your health care provider. Document Revised: 09/20/2017 Document Reviewed: 09/20/2017 Elsevier Patient Education  [place].    IMPORTANT INFORMATION: PAY CLOSE ATTENTION   PHYSICIAN DISCHARGE INSTRUCTIONS  Follow with Primary care provider  Patient, No Pcp Per  and other consultants as instructed by your Hospitalist Physician  [place] IF SYMPTOMS COME BACK, WORSEN OR NEW PROBLEM DEVELOPS   Please note: You were cared for by a hospitalist during your hospital stay. Every effort will be made to forward records to your primary care provider.  You can request that your primary care provider send for your hospital records if they have not received them.  Once you are discharged, your primary care physician will handle any further medical issues. Please note that NO REFILLS for any discharge medications will be authorized once you are discharged, as it is imperative that you return to your primary care physician (or establish a relationship with a primary care physician if you do not have one) for your post hospital discharge  needs so that they can reassess your need for medications and monitor your lab values.  Please get a complete blood count and chemistry panel checked by your Primary MD at your next visit, and again as instructed by your Primary MD.  Get Medicines reviewed and adjusted: Please take all your medications with you for your next visit with your Primary MD  Laboratory/radiological data: Please request your Primary MD to go over all hospital tests and procedure/radiological results at the follow up, please ask your primary care provider to get all Hospital records sent to his/her office.  In some cases, they will be blood work, cultures and biopsy results pending at the time of your discharge. Please request that your primary care provider follow up on these results.  If you are diabetic, please bring your blood sugar readings with you to your follow up appointment with primary care.    Please call and make your follow up appointments as soon as possible.    Also Note the following: If you experience worsening of your admission symptoms, develop shortness of breath, life threatening emergency, suicidal or homicidal thoughts you must seek medical attention immediately by calling 911 or calling your MD immediately  if symptoms less severe.  You must read complete instructions/literature along with all the possible adverse reactions/side effects for all the Medicines you take and that have been prescribed to you. Take any new Medicines after you have completely understood and accpet all the possible adverse reactions/side effects.   Do not drive when taking Pain medications or sleeping medications (Benzodiazepines)  Do not take more than prescribed Pain, Sleep and Anxiety Medications. It is not advisable to combine anxiety,sleep and pain medications without talking with your primary care practitioner  Special Instructions: If you have smoked or chewed Tobacco  in the last 2 yrs please stop smoking,  stop any regular Alcohol  and or any Recreational drug use.  Wear Seat belts while driving.  Do not drive if taking any narcotic, mind altering or controlled substances or recreational drugs or alcohol.

## 2019-05-24 NOTE — Consult Note (Signed)
Telepsych Consultation   Reason for Consult:  Depression  Referring Physician:  Dr. Irwin Brakeman Location of Patient: [facility] Location of Provider: [facility]  Patient Identification: Miguel Elliott MRN:  [id number] Principal Diagnosis: Chest pain Diagnosis:  Principal Problem:   Chest pain Active Problems:   Hypertension   GERD (gastroesophageal reflux disease)   Anxiety with depression   Hyperglycemia   Morbid obesity ([place])   Polysubstance abuse ([place])   Total Time spent with patient: 20 minutes  Subjective:   Miguel Elliott is a 44 y.o. male patient admitted with chest pain, LUE numbeness and tingling. Psych consult was placed due to substance use as reported by patient. Patient states that he was partying and used some cocaine 3 days ago. He reports that he uses intermittently and not daily use. He also denies daily use of any other illicit substances and or alcohol use. He denies any previous psychiatric history.   HPI:  Miguel Elliott is a 45 y.o. male with medical history significant of anxiety, depression, PTSD, EtOH use, esophageal varices, Mallory-Weiss tear, gastric erosions, GERD, hiatal hernia, portal hypertensive gastropathy, hypertension who has not taken prescription medications in about a year who is coming to the emergency department with complaints of left-sided sharp chest pain associated with left upper extremity tingling/numbness that lasts a few seconds and happens a few times a day.  No nausea, emesis, dizziness or lightheadedness, but the patient endorses having diaphoresis and one of the episodes.  This is not associated specifically with exertion. He had lower extremity edema recently, but this has subsided.  Denies PND, orthopnea or palpitations.  No fever, chills, wheezing, productive cough, hemoptysis, abdominal pain, diarrhea, constipation, melena or hematochezia.  No obese Denies dysuria, frequency or hematuria.  No polyuria, polydipsia,  polyphagia or blurred vision.  He mentions that he sometimes uses cocaine and last used 3 days ago.  He still drinks alcohol, maybe once or twice a week.   During the evaluation patient was observed lying in bed. He was appropriate throughout the evaluation and engaged well with Probation officer. He states he told the doctor that he was hadn't been off work in a long time, and hated he had to spend time in the hospital. He adamantly denies any psych concerns, history of psych history, previous psych admission. He denies suicide attempts, gestures, threats, or self harm. He reports that he doesn't have homicidal thoughts, and upon clarification he states " who hasnt wanted to kill someone before? " He observed laughing after this statement. He denies si/hi/avh. He denies daily drug use.    Past Psychiatric History: Denies no Risk to Self:  no Risk to Others:  no Prior Inpatient Therapy:  no Prior Outpatient Therapy:    Past Medical History:  Past Medical History:  Diagnosis Date  . Abnormal weight loss    30 pounds since 11/2010  . Anxiety   . Depression    no medication  . Esophageal varices (HCC) 12/22/10    grade 1 egd by Dr. Gala Romney  . Gastric erosions 12/22/10    egd by Dr. Gala Romney  . GERD (gastroesophageal reflux disease)   . Hiatal hernia 12/22/10    egd by Dr. Gala Romney  . Hypertension   . 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 Gala Romney  . Portal hypertensive gastropathy   . PTSD (post-traumatic stress disorder)     Past Surgical History:  Procedure Laterality Date  . ESOPHAGOGASTRODUODENOSCOPY  12/22/2010  Procedure: ESOPHAGOGASTRODUODENOSCOPY (EGD);  Surgeon: Daneil Dolin, MD;  Location: [facility];  Service: Endoscopy;  Laterality: N/A;.    ERE, M-W tear, gastric erosions, bx benign, minimal esophageal variecs, portal gastropathy  . FOOT SURGERY    . NASAL RECONSTRUCTION  2003   Family History:  Family History  Problem Relation Age of Onset  . Hypertension Father   . Diabetes  Father   . Hypertension Paternal Grandmother   . Diabetes Maternal Grandmother   . Colon cancer Neg Hx   . Ulcers Neg Hx    Family Psychiatric  History: Denies Social History:  Social History   Substance and Sexual Activity  Alcohol Use Yes  . Alcohol/week: 15.0 standard drinks  . Types: 15 Shots of liquor per week   Comment: Drinks a fifth a week     Social History   Substance and Sexual Activity  Drug Use Yes  . Types: Marijuana, Cocaine, IV    Social History   Socioeconomic History  . Marital status: Single    Spouse name: Not on file  . Number of children: 2  . Years of education: Not on file  . Highest education level: Not on file  Occupational History  . Occupation: unemployed  Tobacco Use  . Smoking status: Current Some Day Smoker    Packs/day: 0.25    Types: Cigarettes  . Smokeless tobacco: Never Used  Substance and Sexual Activity  . Alcohol use: Yes    Alcohol/week: 15.0 standard drinks    Types: 15 Shots of liquor per week    Comment: Drinks a fifth a week  . Drug use: Yes    Types: Marijuana, Cocaine, IV  . Sexual activity: Yes  Other Topics Concern  . Not on file  Social History Narrative  . Not on file   Social Determinants of Health   Financial Resource Strain:   . Difficulty of Paying Living Expenses: Not on file  Food Insecurity:   . Worried About Charity fundraiser in the Last Year: Not on file  . Ran Out of Food in the Last Year: Not on file  Transportation Needs:   . Lack of Transportation (Medical): Not on file  . Lack of Transportation (Non-Medical): Not on file  Physical Activity:   . Days of Exercise per Week: Not on file  . Minutes of Exercise per Session: Not on file  Stress:   . Feeling of Stress : Not on file  Social Connections:   . Frequency of Communication with Friends and Family: Not on file  . Frequency of Social Gatherings with Friends and Family: Not on file  . Attends Religious Services: Not on file  . Active  Member of Clubs or Organizations: Not on file  . Attends Archivist Meetings: Not on file  . Marital Status: Not on file   Additional Social History:    Allergies:  No Known Allergies  Labs:  Results for orders placed or performed during the hospital encounter of 05/23/19 (from the past 48 hour(s))  D-dimer, quantitative     Status: None   Collection Time: 05/23/19  4:19 PM  Result Value Ref Range   D-Dimer, Quant 0.32 0.00 - 0.50 ug/mL-FEU    Comment: (NOTE) At the manufacturer cut-off of 0.50 ug/mL FEU, this assay has been documented to exclude PE with a sensitivity and negative predictive value of 97 to 99%.  At this time, this assay has not been approved by the FDA to  exclude DVT/VTE. Results should be correlated with clinical presentation. Performed at [facility], [address], [place], [place] [address]   CBC with Differential     Status: None   Collection Time: 05/23/19  4:24 PM  Result Value Ref Range   WBC 7.2 4.0 - 10.5 K/uL   RBC 5.49 4.22 - 5.81 MIL/uL   Hemoglobin 16.7 13.0 - 17.0 g/dL   HCT 51.8 39.0 - 52.0 %   MCV 94.4 80.0 - 100.0 fL   MCH 30.4 26.0 - 34.0 pg   MCHC 32.2 30.0 - 36.0 g/dL   RDW 14.6 11.5 - 15.5 %   Platelets 206 150 - 400 K/uL   nRBC 0.0 0.0 - 0.2 %   Neutrophils Relative % 64 %   Neutro Abs 4.6 1.7 - 7.7 K/uL   Lymphocytes Relative 24 %   Lymphs Abs 1.8 0.7 - 4.0 K/uL   Monocytes Relative 10 %   Monocytes Absolute 0.7 0.1 - 1.0 K/uL   Eosinophils Relative 1 %   Eosinophils Absolute 0.1 0.0 - 0.5 K/uL   Basophils Relative 1 %   Basophils Absolute 0.0 0.0 - 0.1 K/uL   Immature Granulocytes 0 %   Abs Immature Granulocytes 0.02 0.00 - 0.07 K/uL    Comment: Performed at [facility], [address] [place], [place] [address]  Comprehensive metabolic panel     Status: Abnormal   Collection Time: 05/23/19  4:24 PM  Result Value Ref Range   Sodium 139 135 - 145 mmol/L   Potassium 3.7 3.5 - 5.1 mmol/L   Chloride 106 98 - 111  mmol/L   CO2 24 22 - 32 mmol/L   Glucose, Bld 144 (H) 70 - 99 mg/dL   BUN 16 6 - 20 mg/dL   Creatinine, Ser 1.05 0.61 - 1.24 mg/dL   Calcium 9.2 8.9 - 10.3 mg/dL   Total Protein 7.6 6.5 - 8.1 g/dL   Albumin 4.2 3.5 - 5.0 g/dL   AST 21 15 - 41 U/L   ALT 22 0 - 44 U/L   Alkaline Phosphatase 60 38 - 126 U/L   Total Bilirubin 0.9 0.3 - 1.2 mg/dL   GFR calc non Af Amer >60 >60 mL/min   GFR calc Af Amer >60 >60 mL/min   Anion gap 9 5 - 15    Comment: Performed at [facility], [address] [place], [place] [address]  Troponin I (High Sensitivity)     Status: Abnormal   Collection Time: 05/23/19  4:24 PM  Result Value Ref Range   Troponin I (High Sensitivity) 79 (H) <18 ng/L    Comment: (NOTE) Elevated high sensitivity troponin I (hsTnI) values and significant  changes across serial measurements may suggest ACS but many other  chronic and acute conditions are known to elevate hsTnI results.  Refer to the "Links" section for chest pain algorithms and additional  guidance. Performed at [facility], [address], [place], [place] [address]   Brain natriuretic peptide     Status: None   Collection Time: 05/23/19  4:25 PM  Result Value Ref Range   B Natriuretic Peptide 66.0 0.0 - 100.0 pg/mL    Comment: Performed at [facility], [address], [place], [place] [address]  Troponin I (High Sensitivity)     Status: Abnormal   Collection Time: 05/23/19  7:06 PM  Result Value Ref Range   Troponin I (High Sensitivity) 78 (H) <18 ng/L    Comment: (NOTE) Elevated high  sensitivity troponin I (hsTnI) values and significant  changes across serial measurements may suggest ACS but many other  chronic and acute conditions are known to elevate hsTnI results.  Refer to the "Links" section for chest pain algorithms and additional  guidance. Performed at [facility], [address] [place], [place] [address]   Magnesium     Status: None   Collection Time: 05/23/19  7:06 PM  Result Value Ref  Range   Magnesium 2.3 1.7 - 2.4 mg/dL    Comment: Performed at [facility], [address] [place], [place] [address]  Phosphorus     Status: None   Collection Time: 05/23/19  7:06 PM  Result Value Ref Range   Phosphorus 2.9 2.5 - 4.6 mg/dL    Comment: Performed at [facility], [address] [place], [place] [address]  Comprehensive metabolic panel     Status: Abnormal   Collection Time: 05/24/19  6:17 AM  Result Value Ref Range   Sodium 138 135 - 145 mmol/L   Potassium 3.7 3.5 - 5.1 mmol/L   Chloride 104 98 - 111 mmol/L   CO2 26 22 - 32 mmol/L   Glucose, Bld 193 (H) 70 - 99 mg/dL   BUN 15 6 - 20 mg/dL   Creatinine, Ser 1.07 0.61 - 1.24 mg/dL   Calcium 8.4 (L) 8.9 - 10.3 mg/dL   Total Protein 6.7 6.5 - 8.1 g/dL   Albumin 3.6 3.5 - 5.0 g/dL   AST 17 15 - 41 U/L   ALT 17 0 - 44 U/L   Alkaline Phosphatase 59 38 - 126 U/L   Total Bilirubin 0.7 0.3 - 1.2 mg/dL   GFR calc non Af Amer >60 >60 mL/min   GFR calc Af Amer >60 >60 mL/min   Anion gap 8 5 - 15    Comment: Performed at [facility], [address] [place], [place] [address]  Troponin I (High Sensitivity)     Status: Abnormal   Collection Time: 05/24/19  6:17 AM  Result Value Ref Range   Troponin I (High Sensitivity) 69 (H) <18 ng/L    Comment: (NOTE) Elevated high sensitivity troponin I (hsTnI) values and significant  changes across serial measurements may suggest ACS but many other  chronic and acute conditions are known to elevate hsTnI results.  Refer to the "Links" section for chest pain algorithms and additional  guidance. Performed at [facility], [address] [place], [place] [address]     Medications:  Current Facility-Administered Medications  Medication Dose Route Frequency Provider Last Rate Last Admin  . acetaminophen (TYLENOL) tablet 650 mg  650 mg Oral Q6H PRN Reubin Milan, MD       Or  . acetaminophen (TYLENOL) suppository 650 mg  650 mg Rectal Q6H PRN Reubin Milan, MD      .  aspirin EC tablet 162 mg  162 mg Oral Daily Reubin Milan, MD   162 mg at 05/24/19 [id number]  . nicotine (NICODERM CQ - dosed in mg/24 hours) patch 21 mg  21 mg Transdermal Daily Reubin Milan, MD   21 mg at 05/24/19 [id number]  . ondansetron (ZOFRAN) injection 4 mg  4 mg Intravenous Q6H PRN Reubin Milan, MD      . pantoprazole (PROTONIX) EC tablet 40 mg  40 mg Oral Daily Reubin Milan, MD   40 mg at 05/24/19 [id number]    Musculoskeletal: Strength & Muscle Tone: within normal limits Gait & Station: normal  Patient leans: N/A  Psychiatric Specialty Exam: Physical Exam  Review of Systems  Blood pressure 138/77, pulse 71, temperature (!) 97.5 F (36.4 C), temperature source Oral, resp. rate 16, height 5\' 9"  (1.753 m), weight 124.7 kg, SpO2 96 %.Body mass index is 40.61 kg/m.  General Appearance: obese wearing hospital gown  Eye Contact:  Fair  Speech:  Clear and Coherent and Normal Rate  Volume:  Normal  Mood:  Euthymic  Affect:  Appropriate and Congruent  Thought Process:  Coherent and Descriptions of Associations: Intact  Orientation:  Full (Time, Place, and Person)  Thought Content:  WDL  Suicidal Thoughts:  No  Homicidal Thoughts:  No  Memory:  Immediate;   Good Recent;   Good  Judgement:  Good  Insight:  Good  Psychomotor Activity:  Normal  Concentration:  Concentration: Good and Attention Span: Good  Recall:  Good  Fund of Knowledge:  Good  Language:  Good  Akathisia:  No  Handed:  Right  AIMS (if indicated):     Assets:  Communication Skills Desire for Improvement Financial Resources/Insurance Housing Leisure Time Physical Health Transportation  ADL's:  Intact  Cognition:  WNL  Sleep:        Treatment Plan Summary: Plan Psych clear at this time. Please reconsult if needed.   Disposition: No evidence of imminent risk to self or others at present.   Patient does not meet criteria for psychiatric inpatient admission.  This service was provided via  telemedicine using a 2-way, interactive audio and video technology.  Names of all persons participating in this telemedicine service and their role in this encounter. Name: Sheran Fava Role: PMHNP-BC  Name: Radford Pax Role: Patient    Suella Broad, [place] 05/24/2019 10:05 AM

## 2019-05-25 LAB — HIV ANTIBODY (ROUTINE TESTING W REFLEX): HIV Screen 4th Generation wRfx: NONREACTIVE — AB

## 2019-05-28 ENCOUNTER — Other Ambulatory Visit: Payer: Self-pay

## 2019-05-28 ENCOUNTER — Encounter: Payer: BC Managed Care – PPO | Attending: Family Medicine | Admitting: Nutrition

## 2019-05-28 ENCOUNTER — Encounter: Payer: Self-pay | Admitting: Nutrition

## 2019-05-28 DIAGNOSIS — I252 Old myocardial infarction: Secondary | ICD-10-CM | POA: Insufficient documentation

## 2019-05-28 DIAGNOSIS — I10 Essential (primary) hypertension: Secondary | ICD-10-CM | POA: Insufficient documentation

## 2019-05-28 DIAGNOSIS — E118 Type 2 diabetes mellitus with unspecified complications: Secondary | ICD-10-CM | POA: Insufficient documentation

## 2019-05-28 DIAGNOSIS — E1165 Type 2 diabetes mellitus with hyperglycemia: Secondary | ICD-10-CM | POA: Diagnosis not present

## 2019-05-28 DIAGNOSIS — IMO0002 Reserved for concepts with insufficient information to code with codable children: Secondary | ICD-10-CM

## 2019-05-28 NOTE — Patient Instructions (Signed)
Goals Follow My Plate Eat X33443 g CHO at meals Don't skip meals Take meds as prescribed. Drink only water Exercise when able 60 minute 3-4 times per week. Get A1C 5.7% or less. Lose 10 lbs in next 2-3 months

## 2019-05-28 NOTE — Progress Notes (Signed)
  Medical Nutrition Therapy:  Appt start time: 1400 end time:  1500.  Assessment:  Primary concerns today: DIabetes Type 2 New, CHF, HTN. Just got out of hospital from heart attack, New Dx Dm Type 2. A1C 6.8%. Lives by himself. Eats 3 meals per day; usually eats later in eveing after work  1-2 am. Works 4 pm to 12 30 am. Will start on Metformin . Suppose to pick it up tomorrow and get his meter and testing supplies also. Willing and motivated to make changes with diet and exercise to improve his DM. Has follow up appt with Cardiologist and GI MD's.  Lab Results  Component Value Date   HGBA1C 6.7 (H) 05/24/2019   CMP Latest Ref Rng & Units 05/24/2019 05/23/2019 02/14/2019  Glucose 70 - 99 mg/dL 193(H) 144(H) 115(H)  BUN 6 - 20 mg/dL 15 16 14   Creatinine 0.61 - 1.24 mg/dL 1.07 1.05 1.14  Sodium 135 - 145 mmol/L 138 139 137  Potassium 3.5 - 5.1 mmol/L 3.7 3.7 3.7  Chloride 98 - 111 mmol/L 104 106 103  CO2 22 - 32 mmol/L 26 24 24   Calcium 8.9 - 10.3 mg/dL 8.4(L) 9.2 9.0  Total Protein 6.5 - 8.1 g/dL 6.7 7.6 -  Total Bilirubin 0.3 - 1.2 mg/dL 0.7 0.9 -  Alkaline Phos 38 - 126 U/L 59 60 -  AST 15 - 41 U/L 17 21 -  ALT 0 - 44 U/L 17 22 -   Lipid Panel     Component Value Date/Time   CHOL 143 05/24/2019 0617   TRIG 119 05/24/2019 0617   HDL 39 (L) 05/24/2019 0617   CHOLHDL 3.7 05/24/2019 0617   VLDL 24 05/24/2019 0617   LDLCALC 80 05/24/2019 0617     Preferred Learning Style:  No preference indicated   Learning Readiness:   Ready  Change in progress   MEDICATIONS:    DIETARY INTAKE: .    24-hr recall:  Eats 2-3 meals a day but his work schedule.  Usual physical activity: Physical job  Estimated energy needs: 1800-2000 calories 200 g carbohydrates 135 g protein 50 g fat  Progress Towards Goal(s):  In progress.   Nutritional Diagnosis:  NB-1.1 Food and nutrition-related knowledge deficit As related to Diabetes Type 2.  As evidenced by A1C 6.8%.     Intervention:  Nutrition and Diabetes education provided on My Plate, CHO counting, meal planning, portion sizes, timing of meals, avoiding snacks between meals unless having a low blood sugar, target ranges for A1C and blood sugars, signs/symptoms and treatment of hyper/hypoglycemia, monitoring blood sugars, taking medications as prescribed, benefits of exercising 30 minutes per day and prevention of complications of DM. Marland Kitchen  Goals Follow My Plate Eat X33443 g CHO at meals Don't skip meals Take meds as prescribed. Drink only water Exercise when able 60 minute 3-4 times per week. Get A1C 5.7% or less. Lose 10 lbs in next 2-3 months  Teaching Method Utilized:  Visual Auditory Hands on  Handouts given during visit include:  The Plate Method   Meal Plan Card  Diabetes Instructions.   Barriers to learning/adherence to lifestyle change: none  Demonstrated degree of understanding via:  Teach Back   Monitoring/Evaluation:  Dietary intake, exercise, , and body weight in 1 month(s).

## 2019-06-02 DIAGNOSIS — I219 Acute myocardial infarction, unspecified: Secondary | ICD-10-CM

## 2019-06-02 HISTORY — DX: Acute myocardial infarction, unspecified: I21.9

## 2019-06-03 ENCOUNTER — Encounter: Payer: Self-pay | Admitting: Gastroenterology

## 2019-06-03 ENCOUNTER — Other Ambulatory Visit: Payer: Self-pay

## 2019-06-03 ENCOUNTER — Ambulatory Visit (INDEPENDENT_AMBULATORY_CARE_PROVIDER_SITE_OTHER): Payer: BC Managed Care – PPO | Admitting: Gastroenterology

## 2019-06-03 VITALS — BP 177/101 | HR 88 | Temp 97.0°F | Ht 69.0 in | Wt 279.4 lb

## 2019-06-03 DIAGNOSIS — K219 Gastro-esophageal reflux disease without esophagitis: Secondary | ICD-10-CM

## 2019-06-04 NOTE — Progress Notes (Signed)
Patient left before being seen.

## 2019-06-11 ENCOUNTER — Encounter: Payer: Self-pay | Admitting: Cardiovascular Disease

## 2019-06-11 ENCOUNTER — Ambulatory Visit (INDEPENDENT_AMBULATORY_CARE_PROVIDER_SITE_OTHER): Payer: BC Managed Care – PPO | Admitting: Cardiovascular Disease

## 2019-06-11 ENCOUNTER — Other Ambulatory Visit: Payer: Self-pay

## 2019-06-11 VITALS — BP 169/95 | HR 88 | Temp 96.5°F | Ht 69.0 in | Wt 282.0 lb

## 2019-06-11 DIAGNOSIS — E119 Type 2 diabetes mellitus without complications: Secondary | ICD-10-CM

## 2019-06-11 DIAGNOSIS — I10 Essential (primary) hypertension: Secondary | ICD-10-CM

## 2019-06-11 DIAGNOSIS — R079 Chest pain, unspecified: Secondary | ICD-10-CM | POA: Diagnosis not present

## 2019-06-11 MED ORDER — AMLODIPINE BESYLATE 5 MG PO TABS: 5.0000 mg | ORAL_TABLET | Freq: Every day | ORAL | 3 refills | Status: DC

## 2019-06-11 MED ORDER — SPIRONOLACTONE 25 MG PO TABS: 25.0000 mg | ORAL_TABLET | Freq: Every day | ORAL | 3 refills | Status: DC

## 2019-06-11 NOTE — Patient Instructions (Signed)
Medication Instructions:  STOP Lisinopril  START Amlodipine 5 mg daily  START Spironolactone 25 mg daily  *If you need a refill on your cardiac medications before your next appointment, please call your pharmacy*   Lab Work: None today If you have labs (blood work) drawn today and your tests are completely normal, you will receive your results only by: Marland Kitchen MyChart Message (if you have MyChart) OR . A paper copy in the mail If you have any lab test that is abnormal or we need to change your treatment, we will call you to review the results.   Testing/Procedures: Your physician has requested that you have a lexiscan myoview. For further information please visit [email or web address]. Please follow instruction sheet, as given.     Follow-Up: At [facility], you and your health needs are our priority.  As part of our continuing mission to provide you with exceptional heart care, we have created designated Provider Care Teams.  These Care Teams include your primary Cardiologist (physician) and Advanced Practice Providers (APPs -  Physician Assistants and Nurse Practitioners) who all work together to provide you with the care you need, when you need it.  We recommend signing up for the patient portal called "MyChart".  Sign up information is provided on this After Visit Summary.  MyChart is used to connect with patients for Virtual Visits (Telemedicine).  Patients are able to view lab/test results, encounter notes, upcoming appointments, etc.  Non-urgent messages can be sent to your provider as well.   To learn more about what you can do with MyChart, go to [email or web address].    Your next appointment:   3 month(s)  The format for your next appointment:   In Person  Provider:   Kate Sable, MD   Other Instructions None        Thank you for choosing [facility] !

## 2019-06-11 NOTE — Progress Notes (Signed)
CARDIOLOGY CONSULT NOTE  Patient ID: PHILIPP CALLEGARI MRN: [id number] DOB/AGE: [birth year] 44 y.o.  Admit date: (Not on file) Primary Physician: Patient, No Pcp Per Referring Physician: Irwin Brakeman, MD  Reason for Consultation: Chest pain  HPI: Miguel Elliott is a 44 y.o. male who is being seen today for the evaluation of chest pain at the request of Murlean Iba, MD.   Past medical history includes anxiety, depression, PTSD, EtOH use, esophageal varices, Mallory-Weiss tear, gastric erosions, GERD, hiatal hernia, portal hypertensive gastropathy, hypertension, and substance abuse including cocaine.  He was hospitalized for hypertensive urgency and discharged on 05/24/2019.  High-sensitivity troponins were 79, 78, and 69. CBC and D-dimer were normal. BNP was 66.  Chest x-ray was normal.  Echocardiogram demonstrated normal LV systolic function, EF 50 to 55%, moderate LVH, and grade 1 diastolic dysfunction.  I personally reviewed the ECG which demonstrated sinus rhythm with left anterior fascicular block, probable precordial lead reversal, and anterolateral T wave inversions.  BP at the time of ED presentation was 168/100.  He had been off of all prescription medications for 1 year.  He was prescribed aspirin, atorvastatin, and lisinopril at the time of discharge.  Since getting out of the hospital he has not been doing much.  He has avoid fried foods and fast foods altogether.  He has not been smoking and prior to hospitalization, he had been smoking a pack of cigarettes daily.  He seldom has shortness of breath.  He occasionally has chest pain which he describes as a soreness in the retrosternal region with exertion.  He has associated numbness of the left arm.  He has not been drinking alcohol since he was discharged.  He has occasional bilateral leg swelling.  Family history: Several uncles and aunts died of heart disease.   No Known Allergies  Current  Outpatient Medications  Medication Sig Dispense Refill  . acetaminophen (TYLENOL) 325 MG tablet Take 2 tablets (650 mg total) by mouth every 6 (six) hours as needed for mild pain (or Fever >/= 101).    Marland Kitchen atorvastatin (LIPITOR) 10 MG tablet Take 1 tablet (10 mg total) by mouth daily at 6 PM. 30 tablet 0  . blood glucose meter kit and supplies Dispense based on patient and insurance preference. Use up to four times daily as directed. (FOR ICD-10 E10.9, E11.9). 1 each 0  . lisinopril (ZESTRIL) 20 MG tablet Take 1 tablet (20 mg total) by mouth daily. 30 tablet 0  . metFORMIN (GLUCOPHAGE XR) 500 MG 24 hr tablet Take 1 tablet (500 mg total) by mouth 2 (two) times daily with a meal. 60 tablet 1  . pantoprazole (PROTONIX) 40 MG tablet Take 1 tablet (40 mg total) by mouth daily. 30 tablet 1  . aspirin EC 81 MG EC tablet Take 1 tablet (81 mg total) by mouth daily. (Patient not taking: Reported on 06/03/2019)    . nicotine (NICODERM CQ - DOSED IN MG/24 HOURS) 21 mg/24hr patch Place 1 patch (21 mg total) onto the skin daily as needed. (Patient not taking: Reported on 06/03/2019) 28 patch 0   No current facility-administered medications for this visit.    Past Medical History:  Diagnosis Date  . Abnormal weight loss    30 pounds since 11/2010  . Anxiety   . Depression    no medication  . Esophageal varices (HCC) 12/22/10    grade 1 egd by Dr. Gala Romney  . Gastric erosions 12/22/10  egd by Dr. Gala Romney  . GERD (gastroesophageal reflux disease)   . Hiatal hernia 12/22/10    egd by Dr. Gala Romney  . Hypertension   . 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 Gala Romney  . Portal hypertensive gastropathy   . PTSD (post-traumatic stress disorder)     Past Surgical History:  Procedure Laterality Date  . ESOPHAGOGASTRODUODENOSCOPY  12/22/2010   Procedure: ESOPHAGOGASTRODUODENOSCOPY (EGD);  Surgeon: Daneil Dolin, MD;  [facility];  Service: Endoscopy;  Laterality: N/A;.    ERE, M-W tear, gastric erosions, bx  benign, minimal esophageal variecs, portal gastropathy  . FOOT SURGERY    . NASAL RECONSTRUCTION  2003    Social History   Socioeconomic History  . Marital status: Single    Spouse name: Not on file  . Number of children: 2  . Years of education: Not on file  . Highest education level: Not on file  Occupational History  . Occupation: unemployed  Tobacco Use  . Smoking status: Former Smoker    Packs/day: 0.25    Types: Cigarettes  . Smokeless tobacco: Never Used  Substance and Sexual Activity  . Alcohol use: Yes    Alcohol/week: 15.0 standard drinks    Types: 15 Shots of liquor per week    Comment: Drinks a fifth a week- pt denied 06/03/19  . Drug use: Yes    Types: Marijuana, Cocaine, IV    Comment: pt denied 06/03/19  . Sexual activity: Yes  Other Topics Concern  . Not on file  Social History Narrative  . Not on file   Social Determinants of Health   Financial Resource Strain:   . Difficulty of Paying Living Expenses: Not on file  Food Insecurity:   . Worried About Charity fundraiser in the Last Year: Not on file  . Ran Out of Food in the Last Year: Not on file  Transportation Needs:   . Lack of Transportation (Medical): Not on file  . Lack of Transportation (Non-Medical): Not on file  Physical Activity:   . Days of Exercise per Week: Not on file  . Minutes of Exercise per Session: Not on file  Stress:   . Feeling of Stress : Not on file  Social Connections:   . Frequency of Communication with Friends and Family: Not on file  . Frequency of Social Gatherings with Friends and Family: Not on file  . Attends Religious Services: Not on file  . Active Member of Clubs or Organizations: Not on file  . Attends Archivist Meetings: Not on file  . Marital Status: Not on file  Intimate Partner Violence:   . Fear of Current or Ex-Partner: Not on file  . Emotionally Abused: Not on file  . Physically Abused: Not on file  . Sexually Abused: Not on file       Current Meds  Medication Sig  . acetaminophen (TYLENOL) 325 MG tablet Take 2 tablets (650 mg total) by mouth every 6 (six) hours as needed for mild pain (or Fever >/= 101).  Marland Kitchen atorvastatin (LIPITOR) 10 MG tablet Take 1 tablet (10 mg total) by mouth daily at 6 PM.  . blood glucose meter kit and supplies Dispense based on patient and insurance preference. Use up to four times daily as directed. (FOR ICD-10 E10.9, E11.9).  Marland Kitchen lisinopril (ZESTRIL) 20 MG tablet Take 1 tablet (20 mg total) by mouth daily.  . metFORMIN (GLUCOPHAGE XR) 500 MG 24 hr tablet Take  1 tablet (500 mg total) by mouth 2 (two) times daily with a meal.  . pantoprazole (PROTONIX) 40 MG tablet Take 1 tablet (40 mg total) by mouth daily.      Review of systems complete and found to be negative unless listed above in HPI    Physical exam Blood pressure (!) 169/95, pulse 88, temperature (!) 96.5 F (35.8 C), height '5\' 9"'  (1.753 m), weight 282 lb (127.9 kg), SpO2 98 %. General: NAD Neck: No JVD, no thyromegaly or thyroid nodule.  Lungs: Clear to auscultation bilaterally with normal respiratory effort. CV: Nondisplaced PMI. Regular rate and rhythm, normal S1/S2, no S3/S4, no murmur.  No peripheral edema.  No carotid bruit.    Abdomen: Soft, nontender, no distention.  Skin: Intact without lesions or rashes.  Neurologic: Alert and oriented x 3.  Psych: Normal affect. Extremities: No clubbing or cyanosis.  HEENT: Normal.   ECG: Most recent ECG reviewed.   Labs: Lab Results  Component Value Date/Time   K 3.7 05/24/2019 06:17 AM   BUN 15 05/24/2019 06:17 AM   CREATININE 1.07 05/24/2019 06:17 AM   CREATININE 1.11 01/17/2011 09:10 AM   ALT 17 05/24/2019 06:17 AM   TSH 0.553 12/22/2010 05:26 AM   HGB 16.7 05/23/2019 04:24 PM     Lipids: Lab Results  Component Value Date/Time   LDLCALC 80 05/24/2019 06:17 AM   CHOL 143 05/24/2019 06:17 AM   TRIG 119 05/24/2019 06:17 AM   HDL 39 (L) 05/24/2019 06:17 AM         ASSESSMENT AND PLAN:   1. Chest pain: Symptoms while hospitalized were likely secondary to hypertensive urgency.  However, he does have occasional exertional chest pain associated with left arm numbness since being discharged.  His blood pressure remains elevated.  ECG is abnormal and may reflect hypertensive heart disease given his moderate LVH. He has several cardiovascular risk factors. I will arrange for a Lexiscan Myoview. Currently on aspirin and atorvastatin.  I will aim to control blood pressure (see discussion in #2).  2. Hypertension: Blood pressure is significantly elevated. I will discontinue lisinopril and start amlodipine 5 mg daily and spironolactone 25 mg daily.  3. Type 2 diabetes mellitus: Hemoglobin A1c 6.7% on 05/24/2019.  Currently on Glucophage XR 500 mg twice daily. He is also on low-dose statin therapy.     Disposition: Follow up in 3 months  Signed: Kate Sable, M.D., F.A.C.C.  06/11/2019, 1:36 PM

## 2019-06-13 ENCOUNTER — Encounter: Payer: Self-pay | Admitting: Gastroenterology

## 2019-06-13 ENCOUNTER — Ambulatory Visit (INDEPENDENT_AMBULATORY_CARE_PROVIDER_SITE_OTHER): Payer: BC Managed Care – PPO | Admitting: Gastroenterology

## 2019-06-13 ENCOUNTER — Other Ambulatory Visit: Payer: Self-pay

## 2019-06-13 ENCOUNTER — Encounter: Payer: Self-pay | Admitting: *Deleted

## 2019-06-13 VITALS — BP 155/93 | HR 90 | Temp 96.9°F | Ht 69.0 in | Wt 279.8 lb

## 2019-06-13 DIAGNOSIS — K219 Gastro-esophageal reflux disease without esophagitis: Secondary | ICD-10-CM | POA: Diagnosis not present

## 2019-06-13 DIAGNOSIS — K769 Liver disease, unspecified: Secondary | ICD-10-CM | POA: Insufficient documentation

## 2019-06-13 NOTE — Progress Notes (Signed)
Primary Care Physician:  Patient, No Pcp Per Primary Gastroenterologist:  Dr. Gala Romney   Chief Complaint  Patient presents with  . Gastroesophageal Reflux    HPI:   Miguel Elliott is a 44 y.o. male presenting today at the request of Forestine Na due to GERD. He was inpatient at [facility] in Feb 2021 with chest pain. He was last seen by GI in 2012. EGD in 2012 with 2 short columns of Grade 1 varices (non-bleeding), MW tear, erosive reflux esophagitis, portal gastropathy. Last imaging in Oct 2012 with 1.8 cm low density lesion of right lobe, question hemangioma. He was lost to follow-up.   Noted lower extremity edema for several weeks. Went to ED. Blood work showed elevated troponin. Chronic history of GERD. No PPI for over 12 years. Occasional indigestion. Some mid sternal chest pain intermittently. Taking Protonix once daily. No dysphagia. No abdominal pain. Metformin gives looser stool. No rectal bleeding. Blood came up in mouth about a week ago. Felt like regurgitating blood. Like fresh blood. Large amount. No NSAIDs.  Dr. Bronson Ing is arranging Lexiscan.   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   Drug screen positive for cocaine Feb 2021. No Hep C testing that I can see. HIV negative Feb 2021.   Past Medical History:  Diagnosis Date  . Abnormal weight loss    30 pounds since 11/2010  . Anxiety   . Depression    no medication  . Esophageal varices (HCC) 12/22/10    grade 1 egd by Dr. Gala Romney  . Gastric erosions 12/22/10    egd by Dr. Gala Romney  . GERD (gastroesophageal reflux disease)   . Hiatal hernia 12/22/10    egd by Dr. Gala Romney  . Hypertension   . 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 Gala Romney  . Portal hypertensive gastropathy   . PTSD (post-traumatic stress disorder)     Past Surgical History:  Procedure Laterality Date  . ESOPHAGOGASTRODUODENOSCOPY  12/22/2010   Procedure:  ESOPHAGOGASTRODUODENOSCOPY (EGD);  Surgeon: Daneil Dolin, MD;  Location: [facility];  Service: Endoscopy;  Laterality: N/A;.    ERE, M-W tear, gastric erosions, bx benign, minimal esophageal variecs, portal gastropathy  . FOOT SURGERY    . NASAL RECONSTRUCTION  2003    Current Outpatient Medications  Medication Sig Dispense Refill  . acetaminophen (TYLENOL) 325 MG tablet Take 2 tablets (650 mg total) by mouth every 6 (six) hours as needed for mild pain (or Fever >/= 101).    Marland Kitchen amLODipine (NORVASC) 5 MG tablet Take 1 tablet (5 mg total) by mouth daily. 90 tablet 3  . atorvastatin (LIPITOR) 10 MG tablet Take 1 tablet (10 mg total) by mouth daily at 6 PM. 30 tablet 0  . blood glucose meter kit and supplies Dispense based on patient and insurance preference. Use up to four times daily as directed. (FOR ICD-10 E10.9, E11.9). 1 each 0  . metFORMIN (GLUCOPHAGE XR) 500 MG 24 hr tablet Take 1 tablet (500 mg total) by mouth 2 (two) times daily with a meal. 60 tablet 1  . pantoprazole (PROTONIX) 40 MG tablet Take 1 tablet (40 mg total) by mouth daily. 30 tablet 1  . spironolactone (ALDACTONE) 25 MG tablet Take 1 tablet (25 mg total) by mouth daily. 90 tablet 3   No current facility-administered medications for this visit.    Allergies as of 06/13/2019  . (  No Known Allergies)    Family History  Problem Relation Age of Onset  . Hypertension Father   . Diabetes Father   . Hypertension Paternal Grandmother   . Diabetes Maternal Grandmother   . Colon cancer Neg Hx   . Ulcers Neg Hx   . Colon polyps Neg Hx     Social History   Socioeconomic History  . Marital status: Single    Spouse name: Not on file  . Number of children: 2  . Years of education: Not on file  . Highest education level: Not on file  Occupational History  . Occupation: unemployed  Tobacco Use  . Smoking status: Current Every Day Smoker    Packs/day: 0.25    Types: Cigarettes  . Smokeless tobacco: Never Used    Substance and Sexual Activity  . Alcohol use: Yes    Alcohol/week: 15.0 standard drinks    Types: 15 Shots of liquor per week    Comment: Drinks a fifth a week- pt denied 06/13/19 "none past 3 weeks"  . Drug use: Not Currently    Types: Marijuana, Cocaine, IV    Comment: last used in Feb 2021  . Sexual activity: Yes  Other Topics Concern  . Not on file  Social History Narrative  . Not on file   Social Determinants of Health   Financial Resource Strain:   . Difficulty of Paying Living Expenses:   Food Insecurity:   . Worried About Charity fundraiser in the Last Year:   . Arboriculturist in the Last Year:   Transportation Needs:   . Film/video editor (Medical):   Marland Kitchen Lack of Transportation (Non-Medical):   Physical Activity:   . Days of Exercise per Week:   . Minutes of Exercise per Session:   Stress:   . Feeling of Stress :   Social Connections:   . Frequency of Communication with Friends and Family:   . Frequency of Social Gatherings with Friends and Family:   . Attends Religious Services:   . Active Member of Clubs or Organizations:   . Attends Archivist Meetings:   Marland Kitchen Marital Status:   Intimate Partner Violence:   . Fear of Current or Ex-Partner:   . Emotionally Abused:   Marland Kitchen Physically Abused:   . Sexually Abused:     Review of Systems: Gen: Denies any fever, chills, fatigue, weight loss, lack of appetite.  CV: Denies chest pain, heart palpitations, peripheral edema, syncope.  Resp: Denies shortness of breath at rest or with exertion. Denies wheezing or cough.  GI: see HPI GU : Denies urinary burning, urinary frequency, urinary hesitancy MS: Denies joint pain, muscle weakness, cramps, or limitation of movement.  Derm: Denies rash, itching, dry skin Psych: Denies depression, anxiety, memory loss, and confusion Heme: see HPI  Physical Exam: BP (!) 155/93   Pulse 90   Temp (!) 96.9 F (36.1 C) (Temporal)   Ht 5' 9" (1.753 m)   Wt 279 lb 12.8 oz  (126.9 kg)   BMI 41.32 kg/m  General:   Alert and oriented. Pleasant and cooperative. Well-nourished and well-developed.  Head:  Normocephalic and atraumatic. Eyes:  Without icterus, sclera clear and conjunctiva pink.  Ears:  Normal auditory acuity. Lungs:  Clear to auscultation bilaterally. No wheezes, rales, or rhonchi. No distress.  Heart:  S1, S2 present without murmurs appreciated.  Abdomen:  +BS, soft, non-tender and non-distended. No HSM noted. No guarding or rebound. No masses appreciated.  Rectal:  Deferred  Msk:  Symmetrical without gross deformities. Normal posture. Extremities:  Without edema. Neurologic:  Alert and  oriented x4;  grossly normal neurologically. Skin:  Intact without significant lesions or rashes. Psych:  Alert and cooperative. Normal mood and affect.  ASSESSMENT: Miguel Elliott is a 44 y.o. male presenting today with recent admission for chest pain at [facility] in Feb 2021, referred to GI due to GERD. He is still undergoing cardiac evaluation, with Lexiscan upcoming. Currently taking Protonix once daily. He has noted chronic history of GERD without PPI for many years. No melena or rectal bleeding; however, he reports regurgitating blood about a week ago. Difficult historian. Last use of cocaine several weeks ago, with drug screen positive during admission. Prior EGD in 2012 with portal gastropathy and Grade 1 varices non-bleeding. CT at that time without liver disease. Will pursue diagnostic EGD in near future. As he had a liver lesion noted on 2012 imaging, will pursue dedicated CT with liver protocol. I discussed in detail complete avoidance of illicit drugs, as procedure will be cancelled.    PLAN:  Proceed with upper endoscopy in the near future with Dr. Gala Romney. The risks, benefits, and alternatives have been discussed in detail with patient. They have stated understanding and desire to proceed. Propofol due to history of drug and alcohol use  Continue  Protonix once daily  Drug screen prior to EGD  Continue with cardiac evaluation  Will need Hep C screening at some point in the future   Annitta Needs, PhD, [facility]

## 2019-06-13 NOTE — Patient Instructions (Signed)
We are arranging an upper endoscopy in the near future with Dr. Gala Romney.  Take Protonix on an empty stomach 30 minutes before eating, as it is best absorbed this way.  We are arranging imaging of your liver to follow-up on the liver lesion.  Further recommendation to follow!  It was a pleasure to see you today. I want to create trusting relationships with patients to provide genuine, compassionate, and quality care. I value your feedback. If you receive a survey regarding your visit,  I greatly appreciate you taking time to fill this out.   Annitta Needs, PhD, ANP-BC [facility]

## 2019-06-16 ENCOUNTER — Telehealth: Payer: Self-pay | Admitting: *Deleted

## 2019-06-16 ENCOUNTER — Telehealth: Payer: Self-pay | Admitting: Gastroenterology

## 2019-06-16 ENCOUNTER — Encounter: Payer: Self-pay | Admitting: *Deleted

## 2019-06-16 DIAGNOSIS — E119 Type 2 diabetes mellitus without complications: Secondary | ICD-10-CM | POA: Diagnosis not present

## 2019-06-16 DIAGNOSIS — I10 Essential (primary) hypertension: Secondary | ICD-10-CM | POA: Diagnosis not present

## 2019-06-16 DIAGNOSIS — I252 Old myocardial infarction: Secondary | ICD-10-CM | POA: Diagnosis not present

## 2019-06-16 DIAGNOSIS — E785 Hyperlipidemia, unspecified: Secondary | ICD-10-CM | POA: Diagnosis not present

## 2019-06-16 NOTE — Telephone Encounter (Signed)
Called [employer]. Received message no PA is required for EGD or the CT liver. Ref# [id number]

## 2019-06-16 NOTE — Telephone Encounter (Signed)
Erline Levine, please arrange outpatient follow-up in June following EGD. Thanks!

## 2019-06-17 ENCOUNTER — Encounter: Payer: Self-pay | Admitting: Internal Medicine

## 2019-06-17 NOTE — Telephone Encounter (Signed)
Patient scheduled and letter sent  °

## 2019-06-17 NOTE — Progress Notes (Signed)
No pcp per patient 

## 2019-06-20 ENCOUNTER — Encounter (HOSPITAL_COMMUNITY): Payer: BC Managed Care – PPO

## 2019-06-24 ENCOUNTER — Ambulatory Visit: Payer: BC Managed Care – PPO | Admitting: Nutrition

## 2019-06-25 ENCOUNTER — Ambulatory Visit: Payer: BC Managed Care – PPO | Admitting: Gastroenterology

## 2019-06-25 ENCOUNTER — Ambulatory Visit (HOSPITAL_COMMUNITY): Admission: RE | Admit: 2019-06-25 | Payer: BC Managed Care – PPO | Source: Ambulatory Visit

## 2019-07-01 ENCOUNTER — Ambulatory Visit: Payer: BC Managed Care – PPO | Admitting: Dietician

## 2019-07-09 ENCOUNTER — Telehealth: Payer: Self-pay | Admitting: Gastroenterology

## 2019-07-09 NOTE — Telephone Encounter (Signed)
LMTCB for pt 

## 2019-07-09 NOTE — Telephone Encounter (Signed)
Patient was to complete lexiscan per cardiology. We can't pursue EGD until he has clearance from that standpoint. May need procedure postpone (08/25/19 EGD with Propofol).

## 2019-07-10 NOTE — Telephone Encounter (Signed)
Called spoke with pt. Procedure cancelled for now. Once he gets clearance we will reschedule. Called endo and made aware.

## 2019-07-14 DIAGNOSIS — I10 Essential (primary) hypertension: Secondary | ICD-10-CM | POA: Diagnosis not present

## 2019-07-14 DIAGNOSIS — E785 Hyperlipidemia, unspecified: Secondary | ICD-10-CM | POA: Diagnosis not present

## 2019-07-14 DIAGNOSIS — I252 Old myocardial infarction: Secondary | ICD-10-CM | POA: Diagnosis not present

## 2019-07-14 DIAGNOSIS — E119 Type 2 diabetes mellitus without complications: Secondary | ICD-10-CM | POA: Diagnosis not present

## 2019-08-22 ENCOUNTER — Other Ambulatory Visit (HOSPITAL_COMMUNITY): Payer: BC Managed Care – PPO

## 2019-08-25 ENCOUNTER — Encounter (HOSPITAL_COMMUNITY): Payer: Self-pay

## 2019-08-25 ENCOUNTER — Ambulatory Visit (HOSPITAL_COMMUNITY): Admit: 2019-08-25 | Payer: BC Managed Care – PPO | Admitting: Internal Medicine

## 2019-08-25 SURGERY — ESOPHAGOGASTRODUODENOSCOPY (EGD) WITH PROPOFOL
Anesthesia: Monitor Anesthesia Care

## 2019-08-28 ENCOUNTER — Encounter (HOSPITAL_COMMUNITY): Payer: Self-pay | Admitting: Emergency Medicine

## 2019-08-28 ENCOUNTER — Other Ambulatory Visit: Payer: Self-pay

## 2019-08-28 ENCOUNTER — Observation Stay (HOSPITAL_COMMUNITY)
Admission: EM | Admit: 2019-08-28 | Discharge: 2019-08-28 | Disposition: A | Discharge status: Home / Self Care | Payer: BC Managed Care – PPO | Attending: Internal Medicine | Admitting: Internal Medicine

## 2019-08-28 ENCOUNTER — Emergency Department (HOSPITAL_COMMUNITY): Payer: BC Managed Care – PPO

## 2019-08-28 DIAGNOSIS — R079 Chest pain, unspecified: Principal | ICD-10-CM

## 2019-08-28 DIAGNOSIS — Z833 Family history of diabetes mellitus: Secondary | ICD-10-CM | POA: Insufficient documentation

## 2019-08-28 DIAGNOSIS — F17210 Nicotine dependence, cigarettes, uncomplicated: Secondary | ICD-10-CM | POA: Insufficient documentation

## 2019-08-28 DIAGNOSIS — G4730 Sleep apnea, unspecified: Secondary | ICD-10-CM | POA: Diagnosis not present

## 2019-08-28 DIAGNOSIS — K219 Gastro-esophageal reflux disease without esophagitis: Secondary | ICD-10-CM | POA: Diagnosis not present

## 2019-08-28 DIAGNOSIS — K766 Portal hypertension: Secondary | ICD-10-CM | POA: Diagnosis not present

## 2019-08-28 DIAGNOSIS — Z8719 Personal history of other diseases of the digestive system: Secondary | ICD-10-CM | POA: Insufficient documentation

## 2019-08-28 DIAGNOSIS — Z7984 Long term (current) use of oral hypoglycemic drugs: Secondary | ICD-10-CM | POA: Insufficient documentation

## 2019-08-28 DIAGNOSIS — R0602 Shortness of breath: Secondary | ICD-10-CM | POA: Diagnosis not present

## 2019-08-28 DIAGNOSIS — Z79899 Other long term (current) drug therapy: Secondary | ICD-10-CM | POA: Diagnosis not present

## 2019-08-28 DIAGNOSIS — I252 Old myocardial infarction: Secondary | ICD-10-CM | POA: Diagnosis not present

## 2019-08-28 DIAGNOSIS — E119 Type 2 diabetes mellitus without complications: Secondary | ICD-10-CM | POA: Diagnosis not present

## 2019-08-28 DIAGNOSIS — I10 Essential (primary) hypertension: Secondary | ICD-10-CM

## 2019-08-28 DIAGNOSIS — R9431 Abnormal electrocardiogram [ECG] [EKG]: Secondary | ICD-10-CM | POA: Diagnosis not present

## 2019-08-28 DIAGNOSIS — I471 Supraventricular tachycardia, unspecified: Secondary | ICD-10-CM | POA: Diagnosis present

## 2019-08-28 DIAGNOSIS — Z6841 Body Mass Index (BMI) 40.0 and over, adult: Secondary | ICD-10-CM | POA: Insufficient documentation

## 2019-08-28 DIAGNOSIS — Z8249 Family history of ischemic heart disease and other diseases of the circulatory system: Secondary | ICD-10-CM | POA: Diagnosis not present

## 2019-08-28 DIAGNOSIS — R0689 Other abnormalities of breathing: Secondary | ICD-10-CM | POA: Diagnosis not present

## 2019-08-28 DIAGNOSIS — E785 Hyperlipidemia, unspecified: Secondary | ICD-10-CM | POA: Diagnosis not present

## 2019-08-28 DIAGNOSIS — F1490 Cocaine use, unspecified, uncomplicated: Secondary | ICD-10-CM | POA: Diagnosis not present

## 2019-08-28 DIAGNOSIS — Z20822 Contact with and (suspected) exposure to covid-19: Secondary | ICD-10-CM | POA: Insufficient documentation

## 2019-08-28 DIAGNOSIS — F1410 Cocaine abuse, uncomplicated: Secondary | ICD-10-CM

## 2019-08-28 DIAGNOSIS — I119 Hypertensive heart disease without heart failure: Secondary | ICD-10-CM | POA: Insufficient documentation

## 2019-08-28 DIAGNOSIS — R52 Pain, unspecified: Secondary | ICD-10-CM | POA: Diagnosis not present

## 2019-08-28 DIAGNOSIS — R0902 Hypoxemia: Secondary | ICD-10-CM | POA: Diagnosis not present

## 2019-08-28 LAB — COMPREHENSIVE METABOLIC PANEL
ALT: 19 U/L (ref 0–44)
AST: 15 U/L (ref 15–41)
Albumin: 3.8 g/dL (ref 3.5–5.0)
Alkaline Phosphatase: 54 U/L (ref 38–126)
Anion gap: 10 (ref 5–15)
BUN: 13 mg/dL (ref 6–20)
CO2: 23 mmol/L (ref 22–32)
Calcium: 8.7 mg/dL — ABNORMAL LOW (ref 8.9–10.3)
Chloride: 107 mmol/L (ref 98–111)
Creatinine, Ser: 0.99 mg/dL (ref 0.61–1.24)
GFR calc Af Amer: >60 mL/min (ref >60)
GFR calc non Af Amer: >60 mL/min (ref >60)
Glucose, Bld: 117 mg/dL — ABNORMAL HIGH (ref 70–99)
Potassium: 4 mmol/L (ref 3.5–5.1)
Sodium: 140 mmol/L (ref 135–145)
Total Bilirubin: 0.7 mg/dL (ref 0.3–1.2)
Total Protein: 6.9 g/dL (ref 6.5–8.1)

## 2019-08-28 LAB — CBC WITH DIFFERENTIAL/PLATELET
Abs Immature Granulocytes: 0.02 10*3/uL (ref 0.00–0.07)
Basophils Absolute: 0 10*3/uL (ref 0.0–0.1)
Basophils Relative: 0 %
Eosinophils Absolute: 0 10*3/uL (ref 0.0–0.5)
Eosinophils Relative: 0 %
HCT: 45.9 % (ref 39.0–52.0)
Hemoglobin: 15.1 g/dL (ref 13.0–17.0)
Immature Granulocytes: 0 %
Lymphocytes Relative: 25 %
Lymphs Abs: 1.7 10*3/uL (ref 0.7–4.0)
MCH: 30.1 pg (ref 26.0–34.0)
MCHC: 32.9 g/dL (ref 30.0–36.0)
MCV: 91.4 fL (ref 80.0–100.0)
Monocytes Absolute: 0.5 10*3/uL (ref 0.1–1.0)
Monocytes Relative: 7 %
Neutro Abs: 4.7 10*3/uL (ref 1.7–7.7)
Neutrophils Relative %: 68 %
Platelets: 224 10*3/uL (ref 150–400)
RBC: 5.02 MIL/uL (ref 4.22–5.81)
RDW: 14 % (ref 11.5–15.5)
WBC: 7 10*3/uL (ref 4.0–10.5)
nRBC: 0 % (ref 0.0–0.2)

## 2019-08-28 LAB — BASIC METABOLIC PANEL
Anion gap: 10 (ref 5–15)
BUN: 15 mg/dL (ref 6–20)
CO2: 22 mmol/L (ref 22–32)
Calcium: 8.8 mg/dL — ABNORMAL LOW (ref 8.9–10.3)
Chloride: 106 mmol/L (ref 98–111)
Creatinine, Ser: 1.38 mg/dL — ABNORMAL HIGH (ref 0.61–1.24)
GFR calc Af Amer: >60 mL/min (ref >60)
GFR calc non Af Amer: >60 mL/min (ref >60)
Glucose, Bld: 202 mg/dL — ABNORMAL HIGH (ref 70–99)
Potassium: 3.4 mmol/L — ABNORMAL LOW (ref 3.5–5.1)
Sodium: 138 mmol/L (ref 135–145)

## 2019-08-28 LAB — CBC
HCT: 45.6 % (ref 39.0–52.0)
Hemoglobin: 14.6 g/dL (ref 13.0–17.0)
MCH: 30.4 pg (ref 26.0–34.0)
MCHC: 32 g/dL (ref 30.0–36.0)
MCV: 95 fL (ref 80.0–100.0)
Platelets: 219 10*3/uL (ref 150–400)
RBC: 4.8 MIL/uL (ref 4.22–5.81)
RDW: 14.3 % (ref 11.5–15.5)
WBC: 8 10*3/uL (ref 4.0–10.5)
nRBC: 0 % (ref 0.0–0.2)

## 2019-08-28 LAB — TROPONIN I (HIGH SENSITIVITY)
Troponin I (High Sensitivity): 9 ng/L (ref <18)
Troponin I (High Sensitivity): 10 ng/L (ref <18)
Troponin I (High Sensitivity): 30 ng/L — ABNORMAL HIGH (ref <18)
Troponin I (High Sensitivity): 61 ng/L — ABNORMAL HIGH (ref <18)

## 2019-08-28 LAB — SARS CORONAVIRUS 2 BY RT PCR (HOSPITAL ORDER, PERFORMED IN ~~LOC~~ HOSPITAL LAB): SARS Coronavirus 2: NEGATIVE

## 2019-08-28 LAB — CBG MONITORING, ED: Glucose-Capillary: 115 mg/dL — ABNORMAL HIGH (ref 70–99)

## 2019-08-28 LAB — HEMOGLOBIN A1C
Hgb A1c MFr Bld: 7.3 % — ABNORMAL HIGH (ref 4.8–5.6)
Mean Plasma Glucose: 162.81 mg/dL

## 2019-08-28 LAB — TSH: TSH: 0.46 u[IU]/mL (ref 0.350–4.500)

## 2019-08-28 MED ORDER — ACETAMINOPHEN 325 MG PO TABS: 650.0000 mg | ORAL_TABLET | Freq: Four times a day (QID) | ORAL | Status: DC | PRN

## 2019-08-28 MED ORDER — SPIRONOLACTONE 25 MG PO TABS
25.0000 mg | ORAL_TABLET | Freq: Every day | ORAL | Status: DC
  Filled 2019-08-28 (×2): qty 1

## 2019-08-28 MED ORDER — SODIUM CHLORIDE 0.9 % IV BOLUS
1000.0000 mL | Freq: Once | INTRAVENOUS | Status: AC
  Administered 2019-08-28: 1000 mL via INTRAVENOUS

## 2019-08-28 MED ORDER — PANTOPRAZOLE SODIUM 40 MG PO TBEC
40.0000 mg | DELAYED_RELEASE_TABLET | Freq: Every day | ORAL | Status: DC
  Administered 2019-08-28: 40 mg via ORAL
  Filled 2019-08-28: qty 1

## 2019-08-28 MED ORDER — POTASSIUM CHLORIDE CRYS ER 20 MEQ PO TBCR
40.0000 meq | EXTENDED_RELEASE_TABLET | Freq: Once | ORAL | Status: AC
  Administered 2019-08-28: 40 meq via ORAL
  Filled 2019-08-28: qty 2

## 2019-08-28 MED ORDER — SODIUM CHLORIDE 0.9 % IV SOLN: INTRAVENOUS | Status: DC

## 2019-08-28 MED ORDER — ATORVASTATIN CALCIUM 10 MG PO TABS: 10.0000 mg | ORAL_TABLET | Freq: Every day | ORAL | Status: DC

## 2019-08-28 MED ORDER — AMLODIPINE BESYLATE 5 MG PO TABS: 5.0000 mg | ORAL_TABLET | Freq: Every day | ORAL | Status: DC

## 2019-08-28 MED ORDER — ENOXAPARIN SODIUM 40 MG/0.4ML ~~LOC~~ SOLN
40.0000 mg | SUBCUTANEOUS | Status: DC
  Administered 2019-08-28: 40 mg via SUBCUTANEOUS
  Filled 2019-08-28: qty 0.4

## 2019-08-28 MED ORDER — LORAZEPAM 2 MG/ML IJ SOLN
1.0000 mg | Freq: Once | INTRAMUSCULAR | Status: AC
  Administered 2019-08-28: 1 mg via INTRAVENOUS
  Filled 2019-08-28: qty 1

## 2019-08-28 MED ORDER — INSULIN ASPART 100 UNIT/ML ~~LOC~~ SOLN: 0.0000 [IU] | Freq: Four times a day (QID) | SUBCUTANEOUS | Status: DC

## 2019-08-28 NOTE — ED Notes (Signed)
This NT went to pt room to repeat EKG and pt has left.

## 2019-08-28 NOTE — Progress Notes (Signed)
TOC consulted for SA resources. TOC at the bedside. Patient states he lives with his aunt, no drugs in the home. Patient is agreeable to discuss the options available, list provided. Patient discharge home from ED.

## 2019-08-28 NOTE — H&P (Signed)
TRH H&P    Patient Demographics:    Miguel Elliott, is a 44 y.o. male  MRN: [id number]  DOB - [birth year]  Admit Date - 08/28/2019  Referring MD/NP/PA: Scot Jun  Outpatient Primary MD for the patient is Patient, No Pcp Per  Patient coming from: Home  Chief complaint-chest pain   HPI:    Miguel Elliott  is a 44 y.o. male, with history of depression, GERD, hypertension, PTSD was brought to the ED by ambulance after patient was found to be in unstable SVT.  EMS was called to the scene for chest pain.  Patient reportedly had done cocaine just prior to the onset of chest pain.  He then has started having palpitations with severe shortness of breath.  EMS identified SVT above 200 bpm.  He was shocked 1 time 100 J and converted to sinus tachycardia at 150 bpm.  Patient symptoms have improved.  Patient brought to ED for further evaluation In the ED he denies any chest pain. Troponin x2 is unremarkable. Denies nausea vomiting or diarrhea. Denies abdominal pain or dysuria.    Review of systems:    In addition to the HPI above,    All other systems reviewed and are negative.    Past History of the following :    Past Medical History:  Diagnosis Date  . Abnormal weight loss    30 pounds since 11/2010  . Anxiety   . Depression    no medication  . Esophageal varices (HCC) 12/22/10    grade 1 egd by Dr. Gala Romney  . Gastric erosions 12/22/10    egd by Dr. Gala Romney  . GERD (gastroesophageal reflux disease)   . Hiatal hernia 12/22/10    egd by Dr. Gala Romney  . Hypertension   . 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 Gala Romney  . Myocardial infarct ([place]) 06/2019  . Portal hypertensive gastropathy   . PTSD (post-traumatic stress disorder)       Past Surgical History:  Procedure Laterality Date  . ESOPHAGOGASTRODUODENOSCOPY  12/22/2010   Procedure: ESOPHAGOGASTRODUODENOSCOPY (EGD);  Surgeon: Daneil Dolin, MD;   [facility];  Service: Endoscopy;  Laterality: N/A;.    ERE, M-W tear, gastric erosions, bx benign, minimal esophageal variecs, portal gastropathy  . FOOT SURGERY    . NASAL RECONSTRUCTION  2003      Social History:      Social History   Tobacco Use  . Smoking status: Current Every Day Smoker    Packs/day: 0.25    Types: Cigarettes  . Smokeless tobacco: Never Used  Substance Use Topics  . Alcohol use: Yes    Alcohol/week: 15.0 standard drinks    Types: 15 Shots of liquor per week    Comment: Drinks a fifth a week- pt denied 06/13/19 "none past 3 weeks"       Family History :     Family History  Problem Relation Age of Onset  . Hypertension Father   . Diabetes Father   . Hypertension Paternal Grandmother   .  Diabetes Maternal Grandmother   . Colon cancer Neg Hx   . Ulcers Neg Hx   . Colon polyps Neg Hx       Home Medications:   Prior to Admission medications   Medication Sig Start Date End Date Taking? Authorizing Provider  acetaminophen (TYLENOL) 325 MG tablet Take 2 tablets (650 mg total) by mouth every 6 (six) hours as needed for mild pain (or Fever >/= 101). 05/24/19   Johnson, Clanford L, MD  amLODipine (NORVASC) 5 MG tablet Take 1 tablet (5 mg total) by mouth daily. 06/11/19 09/09/19  Herminio Commons, MD  atorvastatin (LIPITOR) 10 MG tablet Take 1 tablet (10 mg total) by mouth daily at 6 PM. 05/24/19   Murlean Iba, MD  blood glucose meter kit and supplies Dispense based on patient and insurance preference. Use up to four times daily as directed. (FOR ICD-10 E10.9, E11.9). 05/24/19   Wynetta Emery, Clanford L, MD  metFORMIN (GLUCOPHAGE XR) 500 MG 24 hr tablet Take 1 tablet (500 mg total) by mouth 2 (two) times daily with a meal. 05/24/19 06/23/19  Johnson, Clanford L, MD  pantoprazole (PROTONIX) 40 MG tablet Take 1 tablet (40 mg total) by mouth daily. 05/25/19   Johnson, Clanford L, MD  spironolactone (ALDACTONE) 25 MG tablet Take 1 tablet (25 mg total)  by mouth daily. 06/11/19 09/09/19  Herminio Commons, MD     Allergies:    No Known Allergies   Physical Exam:   Vitals  Blood pressure (!) 154/82, pulse (!) 108, resp. rate 20, height '5\' 9"'  (1.753 m), weight 127 kg, SpO2 100 %.  1.  General: Appears in no acute distress  2. Psychiatric: Alert, oriented x3, intact insight and judgment  3. Neurologic: Cranial nerves II through XII grossly intact, no focal deficit noted  4. HEENMT:  Atraumatic normocephalic, extraocular muscles are intact  5. Respiratory : Clear to auscultation bilaterally, no wheezing or crackles auscultated  6. Cardiovascular : S1-S2, regular, no murmur auscultated  7. Gastrointestinal:  Abdomen is soft, nontender, no organomegaly     Data Review:    CBC Recent Labs  Lab 08/28/19 0030  WBC 7.0  HGB 15.1  HCT 45.9  PLT 224  MCV 91.4  MCH 30.1  MCHC 32.9  RDW 14.0  LYMPHSABS 1.7  MONOABS 0.5  EOSABS 0.0  BASOSABS 0.0   ------------------------------------------------------------------------------------------------------------------  Results for orders placed or performed during the hospital encounter of 08/28/19 (from the past 48 hour(s))  CBC with Differential/Platelet     Status: None   Collection Time: 08/28/19 12:30 AM  Result Value Ref Range   WBC 7.0 4.0 - 10.5 K/uL   RBC 5.02 4.22 - 5.81 MIL/uL   Hemoglobin 15.1 13.0 - 17.0 g/dL   HCT 45.9 39.0 - 52.0 %   MCV 91.4 80.0 - 100.0 fL   MCH 30.1 26.0 - 34.0 pg   MCHC 32.9 30.0 - 36.0 g/dL   RDW 14.0 11.5 - 15.5 %   Platelets 224 150 - 400 K/uL   nRBC 0.0 0.0 - 0.2 %   Neutrophils Relative % 68 %   Neutro Abs 4.7 1.7 - 7.7 K/uL   Lymphocytes Relative 25 %   Lymphs Abs 1.7 0.7 - 4.0 K/uL   Monocytes Relative 7 %   Monocytes Absolute 0.5 0.1 - 1.0 K/uL   Eosinophils Relative 0 %   Eosinophils Absolute 0.0 0.0 - 0.5 K/uL   Basophils Relative 0 %   Basophils Absolute 0.0  0.0 - 0.1 K/uL   Immature Granulocytes 0 %   Abs  Immature Granulocytes 0.02 0.00 - 0.07 K/uL    Comment: Performed at [facility], [address] [place], [place] [address]  Basic metabolic panel     Status: Abnormal   Collection Time: 08/28/19 12:30 AM  Result Value Ref Range   Sodium 138 135 - 145 mmol/L   Potassium 3.4 (L) 3.5 - 5.1 mmol/L   Chloride 106 98 - 111 mmol/L   CO2 22 22 - 32 mmol/L   Glucose, Bld 202 (H) 70 - 99 mg/dL    Comment: Glucose reference range applies only to samples taken after fasting for at least 8 hours.   BUN 15 6 - 20 mg/dL   Creatinine, Ser 1.38 (H) 0.61 - 1.24 mg/dL   Calcium 8.8 (L) 8.9 - 10.3 mg/dL   GFR calc non Af Amer >60 >60 mL/min   GFR calc Af Amer >60 >60 mL/min   Anion gap 10 5 - 15    Comment: Performed at [facility], [address] [place], [place] [address]  Troponin I (High Sensitivity)     Status: None   Collection Time: 08/28/19 12:30 AM  Result Value Ref Range   Troponin I (High Sensitivity) 9 <18 ng/L    Comment: (NOTE) Elevated high sensitivity troponin I (hsTnI) values and significant  changes across serial measurements may suggest ACS but many other  chronic and acute conditions are known to elevate hsTnI results.  Refer to the "Links" section for chest pain algorithms and additional  guidance. Performed at [facility], [address], [place], [place] [address]   Troponin I (High Sensitivity)     Status: None   Collection Time: 08/28/19  2:15 AM  Result Value Ref Range   Troponin I (High Sensitivity) 10 <18 ng/L    Comment: (NOTE) Elevated high sensitivity troponin I (hsTnI) values and significant  changes across serial measurements may suggest ACS but many other  chronic and acute conditions are known to elevate hsTnI results.  Refer to the "Links" section for chest pain algorithms and additional  guidance. Performed at [facility], [address] [place], [place] [address]   SARS Coronavirus 2 by RT PCR (hospital order, performed in [facility] hospital lab)  Nasopharyngeal Nasopharyngeal Swab     Status: None   Collection Time: 08/28/19  2:28 AM   Specimen: Nasopharyngeal Swab  Result Value Ref Range   SARS Coronavirus 2 NEGATIVE NEGATIVE    Comment: (NOTE) SARS-CoV-2 target nucleic acids are NOT DETECTED. The SARS-CoV-2 RNA is generally detectable in upper and lower respiratory specimens during the acute phase of infection. The lowest concentration of SARS-CoV-2 viral copies this assay can detect is 250 copies / mL. A negative result does not preclude SARS-CoV-2 infection and should not be used as the sole basis for treatment or other patient management decisions.  A negative result may occur with improper specimen collection / handling, submission of specimen other than nasopharyngeal swab, presence of viral mutation(s) within the areas targeted by this assay, and inadequate number of viral copies (<250 copies / mL). A negative result must be combined with clinical observations, patient history, and epidemiological information. Fact Sheet for Patients:   StrictlyIdeas.no Fact Sheet for Healthcare Providers: BankingDealers.co.za This test is not yet approved or cleared  by the [place] FDA and has been authorized for detection and/or diagnosis of SARS-CoV-2 by FDA under an Emergency Use Authorization (EUA).  This EUA  will remain in effect (meaning this test can be used) for the duration of the COVID-19 declaration under Section 564(b)(1) of the Act, 21 U.S.C. section 360bbb-3(b)(1), unless the authorization is terminated or revoked sooner. Performed at [facility], [address] [place], [place] [address]     Chemistries  Recent Labs  Lab 08/28/19 0030  NA 138  K 3.4*  CL 106  CO2 22  GLUCOSE 202*  BUN 15  CREATININE 1.38*  CALCIUM 8.8*    ------------------------------------------------------------------------------------------------------------------  ------------------------------------------------------------------------------------------------------------------ GFR: Estimated Creatinine Clearance: 91 mL/min (A) (by C-G formula based on SCr of 1.38 mg/dL (H)). Liver Function Tests: No results for input(s): AST, ALT, ALKPHOS, BILITOT, PROT, ALBUMIN in the last 168 hours. No results for input(s): LIPASE, AMYLASE in the last 168 hours. No results for input(s): AMMONIA in the last 168 hours. Coagulation Profile: No results for input(s): INR, PROTIME in the last 168 hours. Cardiac Enzymes: No results for input(s): CKTOTAL, CKMB, CKMBINDEX, TROPONINI in the last 168 hours. BNP (last 3 results) No results for input(s): PROBNP in the last 8760 hours. HbA1C: No results for input(s): HGBA1C in the last 72 hours. CBG: No results for input(s): GLUCAP in the last 168 hours. Lipid Profile: No results for input(s): CHOL, HDL, LDLCALC, TRIG, CHOLHDL, LDLDIRECT in the last 72 hours. Thyroid Function Tests: No results for input(s): TSH, T4TOTAL, FREET4, T3FREE, THYROIDAB in the last 72 hours. Anemia Panel: No results for input(s): VITAMINB12, FOLATE, FERRITIN, TIBC, IRON, RETICCTPCT in the last 72 hours.  --------------------------------------------------------------------------------------------------------------- Urine analysis:    Component Value Date/Time   COLORURINE YELLOW 02/14/2019 1402   APPEARANCEUR CLEAR 02/14/2019 1402   LABSPEC 1.023 02/14/2019 1402   PHURINE 5.0 02/14/2019 1402   GLUCOSEU NEGATIVE 02/14/2019 1402   HGBUR SMALL (A) 02/14/2019 1402   BILIRUBINUR NEGATIVE 02/14/2019 1402   KETONESUR 5 (A) 02/14/2019 1402   PROTEINUR NEGATIVE 02/14/2019 1402   UROBILINOGEN 1.0 12/23/2010 2230   NITRITE NEGATIVE 02/14/2019 1402   LEUKOCYTESUR NEGATIVE 02/14/2019 1402      Imaging Results:    DG Chest Port  1 View  Result Date: 08/28/2019 CLINICAL DATA:  Shortness of breath and chest pain after cocaine usage EXAM: PORTABLE CHEST 1 VIEW COMPARISON:  Radiograph 05/23/2019 FINDINGS: Low volumes with streaky atelectasis. No consolidation, features of edema, pneumothorax, or effusion. The cardiomediastinal contours are unremarkable. No acute osseous or soft tissue abnormality. Degenerative changes are present in the imaged shoulders. Telemetry leads and pacer pads overlie the chest. IMPRESSION: Low volumes with streaky bibasilar atelectasis. No other acute cardiopulmonary abnormality. Electronically Signed   By: Lovena Le M.D.   On: 08/28/2019 01:17    My personal review of EKG: Rhythm NSR, nonspecific ST changes.   Assessment & Plan:    Active Problems:   Chest pain   1. Unstable SVT-patient was found to be in unstable SVT, EMS shocked him with 100 J.  Patient reverted back to normal sinus rhythm.  At this time patient is stable with no symptoms.  We will obtain cardiology consultation in a.m. 2. Chest pain-resolved, likely from underlying SVT and also cocaine use.  Troponin x2 are negative.  Will cycle troponin.  We will continue to monitor. 3. Hypertension-continue home medication including amlodipine, Aldactone 4. Diabetes mellitus type 2-hold oral hypoglycemic agents, will start sliding scale insulin NovoLog. 5. Hyperlipidemia-continue Lipitor   DVT Prophylaxis-   Lovenox   AM Labs Ordered, also please review Full Orders  Family Communication: Admission, patients condition and plan of care  including tests being ordered have been discussed with the patient  who indicate understanding and agree with the plan and Code Status.  Code Status: Full code  Admission status: Observation  Time spent in minutes : 60 minutes   Hester Forget S Dameir Gentzler M.D

## 2019-08-28 NOTE — ED Notes (Signed)
Pt not in room.

## 2019-08-28 NOTE — Discharge Summary (Signed)
Physician Discharge Summary  Miguel Elliott TKZ:[id number] DOB: 03-09-1976 DOA: 08/28/2019  PCP: Patient, No Pcp Per  Admit date: 08/28/2019 Discharge date: 08/28/2019  Admitted From: Home Disposition: Home  Recommendations for Outpatient Follow-up:  1. Follow up with PCP in 1-2 weeks 2. Please obtain BMP/CBC in one week 3. Follow-up with cardiology has been scheduled  Discharge Condition: Stable CODE STATUS: Full code Diet recommendation: Heart healthy, carb modified  Brief/Interim Summary: 44 year old male who presents to the emergency room with chest pain or shortness of breath.  Found to have heart rate over 200 and SVT.  Patient underwent cardioversion by EMS.  Subsequent heart rate was significantly improved.  The patient did admit to using cocaine prior to onset of symptoms.  Follow-up vitals after cardioversion indicated heart rate in the 60s to 70s, blood pressure ranging from 130-170.  He had mild elevation of troponin which was felt to be related to demand ischemia in the setting of tachycardia/cardioversion.  He is not having any further chest pain or shortness of breath at this time.  Patient was seen by cardiology who did not feel any further inpatient work-up was necessary at this time.  He has been scheduled for outpatient follow-up to consider further ischemic work-up.  Remainder of his medical problems remained stable.  Discharge Diagnoses:  Active Problems:   Chest pain   Hypertension   Morbid obesity ([place])   Type 2 diabetes mellitus ([place])   SVT (supraventricular tachycardia) ([place])   Cocaine use    Discharge Instructions  Discharge Instructions    Diet - low sodium heart healthy   Complete by: As directed    Increase activity slowly   Complete by: As directed      Allergies as of 08/28/2019   No Known Allergies     Medication List    TAKE these medications   acetaminophen 325 MG tablet Commonly known as: TYLENOL Take 2 tablets (650 mg total) by mouth  every 6 (six) hours as needed for mild pain (or Fever >/= 101).   amLODipine 5 MG tablet Commonly known as: NORVASC Take 1 tablet (5 mg total) by mouth daily.   atorvastatin 10 MG tablet Commonly known as: LIPITOR Take 1 tablet (10 mg total) by mouth daily at 6 PM.   blood glucose meter kit and supplies Dispense based on patient and insurance preference. Use up to four times daily as directed. (FOR ICD-10 E10.9, E11.9).   metFORMIN 500 MG 24 hr tablet Commonly known as: Glucophage XR Take 1 tablet (500 mg total) by mouth 2 (two) times daily with a meal.   pantoprazole 40 MG tablet Commonly known as: PROTONIX Take 1 tablet (40 mg total) by mouth daily.   spironolactone 25 MG tablet Commonly known as: ALDACTONE Take 1 tablet (25 mg total) by mouth daily.      Follow-up Information    [facility] Follow up on 09/09/2019.   Specialty: Cardiology Why: Cardiology Hospital Follow-up on 09/09/2019 at 3:30 PM. Will be with Katina Dung, NP (works with Dr. Bronson Ing).  Contact information: [place] [phone]         No Known Allergies  Consultations:  Cardiology   Procedures/Studies: DG Chest Port 1 View  Result Date: 08/28/2019 CLINICAL DATA:  Shortness of breath and chest pain after cocaine usage EXAM: PORTABLE CHEST 1 VIEW COMPARISON:  Radiograph 05/23/2019 FINDINGS: Low volumes with streaky atelectasis. No consolidation, features of edema, pneumothorax, or effusion. The cardiomediastinal  contours are unremarkable. No acute osseous or soft tissue abnormality. Degenerative changes are present in the imaged shoulders. Telemetry leads and pacer pads overlie the chest. IMPRESSION: Low volumes with streaky bibasilar atelectasis. No other acute cardiopulmonary abnormality. Electronically Signed   By: Lovena Le M.D.   On: 08/28/2019 01:17      Subjective: No further chest pain or shortness of breath.  Discharge  Exam: Vitals:   08/28/19 0130 08/28/19 0700 08/28/19 0830 08/28/19 0901  BP: (!) 154/82 (!) 141/77 131/86 (!) 170/99  Pulse: (!) 108   70  Resp: 20   18  Temp:    97.8 F (36.6 C)  TempSrc:    Oral  SpO2: 100%   100%  Weight:      Height:        General: Pt is alert, awake, not in acute distress Cardiovascular: RRR, S1/S2 +, no rubs, no gallops Respiratory: CTA bilaterally, no wheezing, no rhonchi Abdominal: Soft, NT, ND, bowel sounds + Extremities: no edema, no cyanosis    The results of significant diagnostics from this hospitalization (including imaging, microbiology, ancillary and laboratory) are listed below for reference.     Microbiology: Recent Results (from the past 240 hour(s))  SARS Coronavirus 2 by RT PCR (hospital order, performed in [facility] hospital lab) Nasopharyngeal Nasopharyngeal Swab     Status: None   Collection Time: 08/28/19  2:28 AM   Specimen: Nasopharyngeal Swab  Result Value Ref Range Status   SARS Coronavirus 2 NEGATIVE NEGATIVE Final    Comment: (NOTE) SARS-CoV-2 target nucleic acids are NOT DETECTED. The SARS-CoV-2 RNA is generally detectable in upper and lower respiratory specimens during the acute phase of infection. The lowest concentration of SARS-CoV-2 viral copies this assay can detect is 250 copies / mL. A negative result does not preclude SARS-CoV-2 infection and should not be used as the sole basis for treatment or other patient management decisions.  A negative result may occur with improper specimen collection / handling, submission of specimen other than nasopharyngeal swab, presence of viral mutation(s) within the areas targeted by this assay, and inadequate number of viral copies (<250 copies / mL). A negative result must be combined with clinical observations, patient history, and epidemiological information. Fact Sheet for Patients:   StrictlyIdeas.no Fact Sheet for Healthcare  Providers: BankingDealers.co.za This test is not yet approved or cleared  by the [place] FDA and has been authorized for detection and/or diagnosis of SARS-CoV-2 by FDA under an Emergency Use Authorization (EUA).  This EUA will remain in effect (meaning this test can be used) for the duration of the COVID-19 declaration under Section 564(b)(1) of the Act, 21 U.S.C. section 360bbb-3(b)(1), unless the authorization is terminated or revoked sooner. Performed at [facility], [address] [place], [place] [address]      Labs: BNP (last 3 results) Recent Labs    05/23/19 1625  BNP 59.5   Basic Metabolic Panel: Recent Labs  Lab 08/28/19 0030 08/28/19 0639  NA 138 140  K 3.4* 4.0  CL 106 107  CO2 22 23  GLUCOSE 202* 117*  BUN 15 13  CREATININE 1.38* 0.99  CALCIUM 8.8* 8.7*   Liver Function Tests: Recent Labs  Lab 08/28/19 0639  AST 15  ALT 19  ALKPHOS 54  BILITOT 0.7  PROT 6.9  ALBUMIN 3.8   No results for input(s): LIPASE, AMYLASE in the last 168 hours. No results for input(s): AMMONIA in the last 168 hours. CBC: Recent Labs  Lab 08/28/19 0030 08/28/19 0639  WBC 7.0 8.0  NEUTROABS 4.7  --   HGB 15.1 14.6  HCT 45.9 45.6  MCV 91.4 95.0  PLT 224 219   Cardiac Enzymes: No results for input(s): CKTOTAL, CKMB, CKMBINDEX, TROPONINI in the last 168 hours. BNP: Invalid input(s): POCBNP CBG: Recent Labs  Lab 08/28/19 0627  GLUCAP 115*   D-Dimer No results for input(s): DDIMER in the last 72 hours. Hgb A1c Recent Labs    08/28/19 0639  HGBA1C 7.3*   Lipid Profile No results for input(s): CHOL, HDL, LDLCALC, TRIG, CHOLHDL, LDLDIRECT in the last 72 hours. Thyroid function studies Recent Labs    08/28/19 0857  TSH 0.460   Anemia work up No results for input(s): VITAMINB12, FOLATE, FERRITIN, TIBC, IRON, RETICCTPCT in the last 72 hours. Urinalysis    Component Value Date/Time   COLORURINE YELLOW 02/14/2019 1402    APPEARANCEUR CLEAR 02/14/2019 1402   LABSPEC 1.023 02/14/2019 1402   PHURINE 5.0 02/14/2019 1402   GLUCOSEU NEGATIVE 02/14/2019 1402   HGBUR SMALL (A) 02/14/2019 1402   BILIRUBINUR NEGATIVE 02/14/2019 1402   KETONESUR 5 (A) 02/14/2019 1402   PROTEINUR NEGATIVE 02/14/2019 1402   UROBILINOGEN 1.0 12/23/2010 2230   NITRITE NEGATIVE 02/14/2019 1402   LEUKOCYTESUR NEGATIVE 02/14/2019 1402   Sepsis Labs Invalid input(s): PROCALCITONIN,  WBC,  LACTICIDVEN Microbiology Recent Results (from the past 240 hour(s))  SARS Coronavirus 2 by RT PCR (hospital order, performed in [facility] hospital lab) Nasopharyngeal Nasopharyngeal Swab     Status: None   Collection Time: 08/28/19  2:28 AM   Specimen: Nasopharyngeal Swab  Result Value Ref Range Status   SARS Coronavirus 2 NEGATIVE NEGATIVE Final    Comment: (NOTE) SARS-CoV-2 target nucleic acids are NOT DETECTED. The SARS-CoV-2 RNA is generally detectable in upper and lower respiratory specimens during the acute phase of infection. The lowest concentration of SARS-CoV-2 viral copies this assay can detect is 250 copies / mL. A negative result does not preclude SARS-CoV-2 infection and should not be used as the sole basis for treatment or other patient management decisions.  A negative result may occur with improper specimen collection / handling, submission of specimen other than nasopharyngeal swab, presence of viral mutation(s) within the areas targeted by this assay, and inadequate number of viral copies (<250 copies / mL). A negative result must be combined with clinical observations, patient history, and epidemiological information. Fact Sheet for Patients:   StrictlyIdeas.no Fact Sheet for Healthcare Providers: BankingDealers.co.za This test is not yet approved or cleared  by the [place] FDA and has been authorized for detection and/or diagnosis of SARS-CoV-2 by FDA under an Emergency  Use Authorization (EUA).  This EUA will remain in effect (meaning this test can be used) for the duration of the COVID-19 declaration under Section 564(b)(1) of the Act, 21 U.S.C. section 360bbb-3(b)(1), unless the authorization is terminated or revoked sooner. Performed at [facility], [address] [place],  [address]      Time coordinating discharge: 75mns  SIGNED:   JKathie Dike MD  [facility] 08/28/2019, 9:13 PM   If 7PM-7AM, please contact night-coverage [email or web address]

## 2019-08-28 NOTE — ED Provider Notes (Signed)
[facility] EMERGENCY DEPARTMENT Provider Note   CSN: [id number] Arrival date & time: 08/28/19  0023     History Chief Complaint  Patient presents with  . Chest Pain    Miguel Elliott is a 44 y.o. male.  Patient presents to the emergency department by ambulance with unstable SVT.  EMS report that they were called to the scene for him secondary to chest pain.  Patient reportedly had done cocaine just prior to onset of the chest pain.  He then began to have extreme palpitations and severe shortness of breath.  EMS identified SVT above 200 bpm.  He was shocked 1 time at 100 J and converted to sinus tachycardia at 150 bpm.  Chest pain and shortness of breath improved.        Past Medical History:  Diagnosis Date  . Abnormal weight loss    30 pounds since 11/2010  . Anxiety   . Depression    no medication  . Esophageal varices (HCC) 12/22/10    grade 1 egd by Dr. Gala Romney  . Gastric erosions 12/22/10    egd by Dr. Gala Romney  . GERD (gastroesophageal reflux disease)   . Hiatal hernia 12/22/10    egd by Dr. Gala Romney  . Hypertension   . 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 Gala Romney  . Myocardial infarct ([place]) 06/2019  . Portal hypertensive gastropathy   . PTSD (post-traumatic stress disorder)     Patient Active Problem List   Diagnosis Date Noted  . Liver lesion 06/13/2019  . Polysubstance abuse ([place]) 05/24/2019  . Type 2 diabetes mellitus ([place]) 05/24/2019  . Chest pain 05/23/2019  . Hypertension   . GERD (gastroesophageal reflux disease)   . Anxiety with depression   . Hyperglycemia   . Morbid obesity ([place])   . Abdominal pain 01/17/2011  . Weight loss, abnormal 01/17/2011  . Vomiting 01/17/2011  . Hematemesis 12/22/2010  . Hypertensive urgency 12/22/2010  . Headache(784.0) 12/22/2010  . Dehydration 12/22/2010  . Hematuria, microscopic 12/22/2010    Past Surgical History:  Procedure Laterality Date  . ESOPHAGOGASTRODUODENOSCOPY  12/22/2010   Procedure:  ESOPHAGOGASTRODUODENOSCOPY (EGD);  Surgeon: Daneil Dolin, MD;  Location: [facility];  Service: Endoscopy;  Laterality: N/A;.    ERE, M-W tear, gastric erosions, bx benign, minimal esophageal variecs, portal gastropathy  . FOOT SURGERY    . NASAL RECONSTRUCTION  2003       Family History  Problem Relation Age of Onset  . Hypertension Father   . Diabetes Father   . Hypertension Paternal Grandmother   . Diabetes Maternal Grandmother   . Colon cancer Neg Hx   . Ulcers Neg Hx   . Colon polyps Neg Hx     Social History   Tobacco Use  . Smoking status: Current Every Day Smoker    Packs/day: 0.25    Types: Cigarettes  . Smokeless tobacco: Never Used  Substance Use Topics  . Alcohol use: Yes    Alcohol/week: 15.0 standard drinks    Types: 15 Shots of liquor per week    Comment: Drinks a fifth a week- pt denied 06/13/19 "none past 3 weeks"  . Drug use: Yes    Types: Marijuana, Cocaine, IV    Comment: last used in 08/27/2019    Home Medications Prior to Admission medications   Medication Sig Start Date End Date Taking? Authorizing Provider  acetaminophen (TYLENOL) 325 MG tablet Take 2 tablets (650 mg total) by mouth every  6 (six) hours as needed for mild pain (or Fever >/= 101). 05/24/19   Johnson, Clanford L, MD  amLODipine (NORVASC) 5 MG tablet Take 1 tablet (5 mg total) by mouth daily. 06/11/19 09/09/19  Herminio Commons, MD  atorvastatin (LIPITOR) 10 MG tablet Take 1 tablet (10 mg total) by mouth daily at 6 PM. 05/24/19   Murlean Iba, MD  blood glucose meter kit and supplies Dispense based on patient and insurance preference. Use up to four times daily as directed. (FOR ICD-10 E10.9, E11.9). 05/24/19   Wynetta Emery, Clanford L, MD  metFORMIN (GLUCOPHAGE XR) 500 MG 24 hr tablet Take 1 tablet (500 mg total) by mouth 2 (two) times daily with a meal. 05/24/19 06/23/19  Johnson, Clanford L, MD  pantoprazole (PROTONIX) 40 MG tablet Take 1 tablet (40 mg total) by mouth daily. 05/25/19    Johnson, Clanford L, MD  spironolactone (ALDACTONE) 25 MG tablet Take 1 tablet (25 mg total) by mouth daily. 06/11/19 09/09/19  Herminio Commons, MD    Allergies    Patient has no known allergies.  Review of Systems   Review of Systems  Respiratory: Positive for shortness of breath.   Cardiovascular: Positive for chest pain and palpitations.  All other systems reviewed and are negative.   Physical Exam Updated Vital Signs BP (!) 154/82   Pulse (!) 108   Resp 20   Ht '5\' 9"'  (1.753 m)   Wt 127 kg   SpO2 100%   BMI 41.35 kg/m   Physical Exam Vitals and nursing note reviewed.  Constitutional:      General: He is not in acute distress.    Appearance: Normal appearance. He is well-developed. He is ill-appearing and diaphoretic.  HENT:     Head: Normocephalic and atraumatic.     Right Ear: Hearing normal.     Left Ear: Hearing normal.     Nose: Nose normal.  Eyes:     Conjunctiva/sclera: Conjunctivae normal.     Pupils: Pupils are equal, round, and reactive to light.  Cardiovascular:     Rate and Rhythm: Regular rhythm. Tachycardia present.     Heart sounds: S1 normal and S2 normal. No murmur. No friction rub. No gallop.   Pulmonary:     Effort: Pulmonary effort is normal. No respiratory distress.     Breath sounds: Normal breath sounds.  Chest:     Chest wall: No tenderness.  Abdominal:     General: Bowel sounds are normal.     Palpations: Abdomen is soft.     Tenderness: There is no abdominal tenderness. There is no guarding or rebound. Negative signs include Murphy's sign and McBurney's sign.     Hernia: No hernia is present.  Musculoskeletal:        General: Normal range of motion.     Cervical back: Normal range of motion and neck supple.  Skin:    General: Skin is warm.     Findings: No rash.  Neurological:     Mental Status: He is alert and oriented to person, place, and time.     GCS: GCS eye subscore is 4. GCS verbal subscore is 5. GCS motor subscore is 6.      Cranial Nerves: No cranial nerve deficit.     Sensory: No sensory deficit.     Coordination: Coordination normal.  Psychiatric:        Speech: Speech normal.        Behavior: Behavior normal.  Thought Content: Thought content normal.     ED Results / Procedures / Treatments   Labs (all labs ordered are listed, but only abnormal results are displayed) Labs Reviewed  BASIC METABOLIC PANEL - Abnormal; Notable for the following components:      Result Value   Potassium 3.4 (*)    Glucose, Bld 202 (*)    Creatinine, Ser 1.38 (*)    Calcium 8.8 (*)    All other components within normal limits  SARS CORONAVIRUS 2 BY RT PCR (HOSPITAL ORDER, [place] [facility])  CBC WITH DIFFERENTIAL/PLATELET  RAPID URINE DRUG SCREEN, HOSP PERFORMED  TROPONIN I (HIGH SENSITIVITY)  TROPONIN I (HIGH SENSITIVITY)    EKG EKG Interpretation  Date/Time:  Thursday Aug 28 2019 00:25:51 EDT Ventricular Rate:  156 PR Interval:    QRS Duration: 83 QT Interval:  263 QTC Calculation: 424 R Axis:   4 Text Interpretation: Sinus tachycardia Ventricular premature complex Nonspecific repol abnormality, diffuse leads Confirmed by Orpah Greek ([id number]) on 08/28/2019 12:28:57 AM   Radiology DG Chest Port 1 View  Result Date: 08/28/2019 CLINICAL DATA:  Shortness of breath and chest pain after cocaine usage EXAM: PORTABLE CHEST 1 VIEW COMPARISON:  Radiograph 05/23/2019 FINDINGS: Low volumes with streaky atelectasis. No consolidation, features of edema, pneumothorax, or effusion. The cardiomediastinal contours are unremarkable. No acute osseous or soft tissue abnormality. Degenerative changes are present in the imaged shoulders. Telemetry leads and pacer pads overlie the chest. IMPRESSION: Low volumes with streaky bibasilar atelectasis. No other acute cardiopulmonary abnormality. Electronically Signed   By: Lovena Le M.D.   On: 08/28/2019 01:17    Procedures Procedures  (including critical care time)  Medications Ordered in ED Medications  sodium chloride 0.9 % bolus 1,000 mL (1,000 mLs Intravenous New Bag/Given 08/28/19 0041)  LORazepam (ATIVAN) injection 1 mg (1 mg Intravenous Given 08/28/19 0106)    ED Course  I have reviewed the triage vital signs and the nursing notes.  Pertinent labs & imaging results that were available during my care of the patient were reviewed by me and considered in my medical decision making (see chart for details).    MDM Rules/Calculators/A&P                      Patient presents to the emergency department for evaluation of SVT.  Patient does have a previous history of hypertension and diabetes as well as a history of alcohol abuse with associated esophageal varices.  He reports that he used cocaine tonight and then immediately developed severe chest pain and shortness of breath.  EMS found the patient to be in SVT with a heart rate above 200.  He was extremely ill-appearing diaphoretic, hypotensive and was shocked.  At arrival to the ER he was exhibiting sinus tachycardia.  This has improved with IV fluids and Ativan.  First troponin normal.  Second troponin normal as well.  Discussed with Dr. De Nurse, on-call for cardiology.  No specific intervention necessary at this time, admit to hospital for observation, cardiology eval in the morning.  Final Clinical Impression(s) / ED Diagnoses Final diagnoses:  SVT (supraventricular tachycardia) ([place])    Rx / DC Orders ED Discharge Orders    None       Iveliz Garay, Gwenyth Allegra, MD 08/28/19 [phone]

## 2019-08-28 NOTE — Consult Note (Addendum)
Cardiology Consult    Patient ID: Miguel Elliott; [id number]; May 19, 1975   Admit date: 08/28/2019 Date of Consult: 08/28/2019  Primary Care Provider: Patient, No Pcp Per Primary Cardiologist: Miguel Sable, Elliott   Patient Profile    Miguel Elliott is a 44 y.o. male with past medical history of HTN, Type 2 DM, GERD, esophageal varices, Mallory-Weiss tear, PTSD and substance use (cocaine and tobacco use) who is being seen today for the evaluation of chest pain and SVT at the request of Dr. Darrick Elliott.   History of Present Illness    Miguel Elliott was admitted to [facility] in 05/2019 for evaluation of chest pain in the setting of hypertensive urgency and having been without his medications for over a year. He did report using cocaine 3 days prior to admission. HS Troponin values peaked at 79 and EKG showed LVH with repolarization abnormalities but no acute changes. An echocardiogram was obtained and showed EF was mildly reduced at 50 to 55% with no regional wall motion abnormalities.  He did have moderate LVH and grade 1 diastolic dysfunction. He was restarted on Lisinopril 10m daily in regards to his HTN.   He established care with Miguel Elliott 06/11/2019 reported occasional episodes of chest discomfort which could occur with exertion. He had made dietary changes since discharge and denied any recurrent alcohol use. BP was elevated at 169/95 at the time of his visit and Lisinopril was discontinued with him being started on Amlodipine 570mdaily and Spironolactone 2534maily. A Lexiscan Myoview was ordered given his reports of exertional chest pain.  He presented back to the ED during the early morning hours of 08/28/2019 for evaluation of chest pain which started after using cocaine. Upon EMS arrival, he was in SVT with heart rate greater than 200, therefore he was shocked at 100 J. He reports being in his usual state of health yesterday but had difficulty sleeping the night prior. After he got off  work, he used a small amount of cocaine per his report and developed chest pain and palpitations 10-15 minutes afterwards. Once cardioverted, his symptoms resolved. He does report a mild soreness along his chest where the pads are in place but denies any symptoms resembling his presenting episode. No recent dyspnea, orthopnea, PND or edema. No recurrent palpitations.   Labs show WBC 7.0, Hgb 15.1, platelets 224, Na+ 138, K+ 3.4 and creatinine 1.38 (previously 1.0 - 1.1). HS Troponin values have been at 9, 10 and 30. COVID negative. CXR shows streaky bibasilar atelectasis. EKG shows sinus tachycardia, HR 156 with ST depression along the lateral leads.   He has received K+ supplementation and received a 1000 mL fluid bolus and was started on IVF at 75 mL/hr. Telemetry shows he was initially in sinus tachycardia but rates have now improved into the 70's to 80's.   Past Medical History:  Diagnosis Date  . Abnormal weight loss    30 pounds since 11/2010  . Anxiety   . Depression    no medication  . Esophageal varices (HCC) 12/22/10    grade 1 egd by Miguel Elliott Gastric erosions 12/22/10    egd by Miguel Elliott GERD (gastroesophageal reflux disease)   . Hiatal hernia 12/22/10    egd by Miguel Elliott Hypertension   . Mallory - Weiss tear 12/22/10   egd by Miguel [place]  . Portal hypertensive gastropathy   . PTSD (post-traumatic  stress disorder)     Past Surgical History:  Procedure Laterality Date  . ESOPHAGOGASTRODUODENOSCOPY  12/22/2010   Procedure: ESOPHAGOGASTRODUODENOSCOPY (EGD);  Surgeon: Miguel Dolin, Elliott;  [facility];  Service: Endoscopy;  Laterality: N/A;.    ERE, M-W tear, gastric erosions, bx benign, minimal esophageal variecs, portal gastropathy  . FOOT SURGERY    . NASAL RECONSTRUCTION  2003     Home Medications:  Prior to Admission medications   Medication Sig Start Date End Date Taking? Authorizing Provider  acetaminophen (TYLENOL)  325 MG tablet Take 2 tablets (650 mg total) by mouth every 6 (six) hours as needed for mild pain (or Fever >/= 101). 05/24/19   Miguel Elliott  amLODipine (NORVASC) 5 MG tablet Take 1 tablet (5 mg total) by mouth daily. 06/11/19 09/09/19  Miguel Commons, Elliott  atorvastatin (LIPITOR) 10 MG tablet Take 1 tablet (10 mg total) by mouth daily at 6 PM. 05/24/19   Miguel Iba, Elliott  blood glucose meter kit and supplies Dispense based on patient and insurance preference. Use up to four times daily as directed. (FOR ICD-10 E10.9, E11.9). 05/24/19   Miguel Elliott, Miguel Elliott, Elliott  metFORMIN (GLUCOPHAGE XR) 500 MG 24 hr tablet Take 1 tablet (500 mg total) by mouth 2 (two) times daily with a meal. 05/24/19 06/23/19  Miguel Elliott  pantoprazole (PROTONIX) 40 MG tablet Take 1 tablet (40 mg total) by mouth daily. 05/25/19   Miguel Elliott  spironolactone (ALDACTONE) 25 MG tablet Take 1 tablet (25 mg total) by mouth daily. 06/11/19 09/09/19  Miguel Commons, Elliott    Inpatient Medications: Scheduled Meds: . amLODipine  5 mg Oral Daily  . atorvastatin  10 mg Oral q1800  . enoxaparin (LOVENOX) injection  40 mg Subcutaneous Q24H  . insulin aspart  0-9 Units Subcutaneous Q6H  . pantoprazole  40 mg Oral Daily  . spironolactone  25 mg Oral Daily   Continuous Infusions: . sodium chloride 75 mL/hr at 08/28/19 0620   PRN Meds: acetaminophen  Allergies:   No Known Allergies  Social History:   Social History   Socioeconomic History  . Marital status: Single    Spouse name: Not on file  . Number of children: 2  . Years of education: Not on file  . Highest education level: Not on file  Occupational History  . Occupation: unemployed  Tobacco Use  . Smoking status: Current Every Day Smoker    Packs/day: 0.25    Types: Cigarettes  . Smokeless tobacco: Never Used  Substance and Sexual Activity  . Alcohol use: Yes    Alcohol/week: 15.0 standard drinks    Types: 15 Shots of liquor per  week    Comment: Drinks a fifth a week- pt denied 06/13/19 "none past 3 weeks"  . Drug use: Yes    Types: Marijuana, Cocaine, IV    Comment: last used in 08/27/2019  . Sexual activity: Yes  Other Topics Concern  . Not on file  Social History Narrative  . Not on file   Social Determinants of Health   Financial Resource Strain:   . Difficulty of Paying Living Expenses:   Food Insecurity:   . Worried About Charity fundraiser in the Last Year:   . Arboriculturist in the Last Year:   Transportation Needs:   . Film/video editor (Medical):   Marland Kitchen Lack of Transportation (Non-Medical):   Physical Activity:   . Days  of Exercise per Week:   . Minutes of Exercise per Session:   Stress:   . Feeling of Stress :   Social Connections:   . Frequency of Communication with Friends and Family:   . Frequency of Social Gatherings with Friends and Family:   . Attends Religious Services:   . Active Member of Clubs or Organizations:   . Attends Archivist Meetings:   Marland Kitchen Marital Status:   Intimate Partner Violence:   . Fear of Current or Ex-Partner:   . Emotionally Abused:   Marland Kitchen Physically Abused:   . Sexually Abused:      Family History:    Family History  Problem Relation Age of Onset  . Hypertension Father   . Diabetes Father   . Hypertension Paternal Grandmother   . Diabetes Maternal Grandmother   . Colon cancer Neg Hx   . Ulcers Neg Hx   . Colon polyps Neg Hx       Review of Systems    General:  No chills, fever, night sweats or weight changes.  Cardiovascular:  No dyspnea on exertion, edema, orthopnea,  paroxysmal nocturnal dyspnea. Positive for chest pain and palpitations.  Dermatological: No rash, lesions/masses Respiratory: No cough, dyspnea Urologic: No hematuria, dysuria Abdominal:   No nausea, vomiting, diarrhea, bright red blood per rectum, melena, or hematemesis Neurologic:  No visual changes, wkns, changes in mental status. All other systems reviewed and  are otherwise negative except as noted above.  Physical Exam/Data    Vitals:   08/28/19 0059 08/28/19 0059 08/28/19 0100 08/28/19 0130  BP: 138/86 (!) 151/86 132/65 (!) 154/82  Pulse:   (!) 126 (!) 108  Resp:   15 20  SpO2:   100% 100%  Weight:      Height:        Intake/Output Summary (Last 24 hours) at 08/28/2019 0821 Last data filed at 08/28/2019 0318 Gross per 24 hour  Intake 1000 ml  Output --  Net 1000 ml   Filed Weights   08/28/19 0029  Weight: 127 kg   Body mass index is 41.35 kg/m.   General: Pleasant obese male appearing in NAD Psych: Answers questions appropriately but quickly falls back asleep.  Neuro: Alert and oriented X 3. Moves all extremities spontaneously. HEENT: Normal  Neck: Supple without bruits or JVD. Lungs:  Resp regular and unlabored, CTA without wheezing or rales. Heart: RRR no s3, s4, or murmurs. Abdomen: Soft, non-tender, non-distended, BS + x 4.  Extremities: No clubbing, cyanosis or lower extremity edema. DP/PT/Radials 2+ and equal bilaterally.   EKG:  The EKG was personally reviewed and demonstrates: Sinus tachycardia, HR 156 with ST depression along the lateral leads.   Labs/Studies     Relevant CV Studies:  Echocardiogram: 05/2019  IMPRESSIONS   1. Left ventricular ejection fraction, by estimation, is 50 to 55%. The  left ventricle has low normal function. The left ventricle has no regional  wall motion abnormalities. There is moderate left ventricular hypertrophy.  Left ventricular diastolic  parameters are consistent with Grade I diastolic dysfunction (impaired  relaxation).  2. Right ventricular systolic function is normal. The right ventricular  size is normal. There is normal pulmonary artery systolic pressure. The  estimated right ventricular systolic pressure is 53.9 mmHg.  3. The mitral valve is grossly normal. Trivial mitral valve  regurgitation.  4. The aortic valve is tricuspid. Aortic valve regurgitation is  not  visualized.  5. The inferior vena cava is normal in  size with greater than 50%  respiratory variability, suggesting right atrial pressure of 3 mmHg.    Laboratory Data:  Chemistry Recent Labs  Lab 08/28/19 0030 08/28/19 0639  NA 138 140  K 3.4* 4.0  CL 106 107  CO2 22 23  GLUCOSE 202* 117*  BUN 15 13  CREATININE 1.38* 0.99  CALCIUM 8.8* 8.7*  GFRNONAA >60 >60  GFRAA >60 >60  ANIONGAP 10 10    Recent Labs  Lab 08/28/19 0639  PROT 6.9  ALBUMIN 3.8  AST 15  ALT 19  ALKPHOS 54  BILITOT 0.7   Hematology Recent Labs  Lab 08/28/19 0030 08/28/19 0639  WBC 7.0 8.0  RBC 5.02 4.80  HGB 15.1 14.6  HCT 45.9 45.6  MCV 91.4 95.0  MCH 30.1 30.4  MCHC 32.9 32.0  RDW 14.0 14.3  PLT 224 219   Cardiac EnzymesNo results for input(s): TROPONINI in the last 168 hours. No results for input(s): TROPIPOC in the last 168 hours.  BNPNo results for input(s): BNP, PROBNP in the last 168 hours.  DDimer No results for input(s): DDIMER in the last 168 hours.  Radiology/Studies:  DG Chest Port 1 View  Result Date: 08/28/2019 CLINICAL DATA:  Shortness of breath and chest pain after cocaine usage EXAM: PORTABLE CHEST 1 VIEW COMPARISON:  Radiograph 05/23/2019 FINDINGS: Low volumes with streaky atelectasis. No consolidation, features of edema, pneumothorax, or effusion. The cardiomediastinal contours are unremarkable. No acute osseous or soft tissue abnormality. Degenerative changes are present in the imaged shoulders. Telemetry leads and pacer pads overlie the chest. IMPRESSION: Low volumes with streaky bibasilar atelectasis. No other acute cardiopulmonary abnormality. Electronically Signed   By: Lovena Le M.D.   On: 08/28/2019 01:17     Assessment & Plan    1. SVT - he developed chest pain and palpitations 10-15 minutes after using Cocaine and upon EMS arrival was found to be in a narrow-complex tachycardia, HR > 200 and was successfully cardioverted at 100 J. Tracings were not  available for review at the time of this encounter.   - K+ was low at 3.4 and has been replaced. Will add-on TSH to prior labs. Recent echo showed a mildly reduced EF at 50-55% with no regional WMA. Would not repeat at this time.  - he has maintained NSR since admission and denies any recurrent symptoms. Will discuss the use of nonselective BB with Dr. Domenic Polite given the patient's recurrent cocaine use.   2. Chest Pain/ Abnormal EKG - EKG on admission showed ST depression along the lateral leads which was likely rate-related. Will obtain a repeat 12-Lead now that rates have improved. He does have LVH with repol on prior tracings. Would continue with plans for an outpatient Lexiscan Myoview once further out from his acute event as he does have multiple cardiac risk factors (HTN, HLD, Type 2 DM, substance use, and family history of CAD).   3. HTN - BP was initially elevated at 177/82, at 154/82 on most recent check. He did not take his BP medications yesterday. He has been restarted on PTA Amlodipine and Spironolactone. It is unclear why Lisinopril was previously discontinued as this or an ARB would be ideal given his Type 2 DM and mild cardiomyopathy unless intolerances at that time were not documented.  4. Substance Abuse - UDS was positive in 05/2019 and he reports recurrent use the night of admission. Cessation advised.    5. Sleep Disordered Breathing - he reports frequent snoring along with daytime  somnolence. Given his HTN, arrhythmia, and body habitus he would benefit from an outpatient sleep study.    For questions or updates, please contact Victor Please consult [email or web address] for contact info under Cardiology/STEMI.  Signed, Erma Heritage, PA-C 08/28/2019, 8:21 AM Pager: [phone]   Attending note:  Patient seen and examined.  I reviewed his records and discussed the case with Ms. Ahmed Prima PA-C.  Patient presents to the [facility] ER after documented SVT requiring  outpatient cardioversion by EMS, occurred after using cocaine.  He was also experiencing chest discomfort at the time.  His history includes hypertension with history of hypertensive urgency in the setting of medication noncompliance, previous cocaine and alcohol use, portal hypertensive gastropathy with previous Mallory-Weiss tear and gastric erosions.  He was seen by Dr. Bronson Ing back in February at which point an outpatient Lexiscan Myoview was recommended, however the patient did not present for the procedure.  Patient in no distress on examination, remains in sinus rhythm by telemetry, systolic blood pressure ranging 130s to 170s.  Lungs are clear, cardiac exam with RRR and no gallop.  Pertinent lab work includes potassium 4.0, creatinine 0.99, high-sensitivity troponin I of 30 and 61 most recently, hemoglobin 14.6, platelets 219.  ECG reviewed and shows a sinus tachycardia with ST segment abnormalities, likely repolarization changes.  Chest x-ray reports streaky atelectasis and low lung volumes.  Echocardiogram from February reported LVEF 50 to 55% with moderate LVH and mild diastolic dysfunction.  Episode of SVT and chest pain after cocaine use, cardioverted by EMS and remains in sinus rhythm at this time.  Minor increase in high-sensitivity troponin I most consistent with myocardial strain/demand ischemia rather than ACS.  ECG shows repolarization abnormalities with elevated heart rate.  Not entirely clear that further inpatient work-up is required.  Cessation of alcohol and substance use recommended.  We will arrange an office follow-up, at this point would not start beta-blocker.  Can further discuss outpatient Lexiscan Myoview as well which was originally recommended in February.  Satira Sark, M.D., F.A.C.C.

## 2019-08-28 NOTE — ED Triage Notes (Signed)
Per [facility], was called out for CP when arrived pt was pale and diaphoretic. Pt told ems he was at work, wasn't feeling good so he left work. Pt stated after he left work he did a small amount of cocaine and started having chest pain. Ems stated pt was in SVT at 200-220. Ems shocked pt at [place]. Ems stated pt last MI was in February.

## 2019-09-08 NOTE — Progress Notes (Deleted)
Cardiology Office Note  Date: 09/08/2019   ID: [name]., DOB 07/15/1975, MRN [id number]  PCP:  Patient, No Pcp Per  Cardiologist:  Kate Sable, MD Electrophysiologist:  None   Chief Complaint: F/U chest pain,   History of Present Illness: Nishanth Mccaughan. is a 44 y.o. male with a history of hypertension, substance abuse including cocaine, portal hypertensive gastropathy, hiatal hernia, GERD, Mallory-Weiss tear, esophageal varices, EtOH use, PTSD, anxiety, depression, gastric erosions.  Hospitalized for hypertensive urgency and discharged on 05/24/2019.  Chest x-ray was normal, echo demonstrated EF 50 to 55%, moderate LVH, grade 1 DD.  Troponins were 79, 78, and 69.  CBC and D-dimer were normal, BNP was 66.  Blood pressure at ED presentation was 168/100.  He had been off all prescription medications for 1 year.  He had  been prescribed aspirin, atorvastatin, lisinopril at time of discharge from recent hospital visit.  He has stopped smoking since being discharged from the hospital.  He had not been drinking since discharge.  He has occasional bilateral leg swelling.  Lexiscan stress test was ordered.  Lisinopril was discontinued and amlodipine 5 mg as well as spironolactone 25 mg were started daily.  His hemoglobin A1c on 05/24/2019 was 6.7%.  He was currently taking Glucophage XR 500 mg twice daily.  Also on low-dose statin therapy.     Past Medical History:  Diagnosis Date  . Abnormal weight loss    30 pounds since 11/2010  . Anxiety   . Depression    no medication  . Esophageal varices (HCC) 12/22/10    grade 1 egd by Dr. Gala Romney  . Gastric erosions 12/22/10    egd by Dr. Gala Romney  . GERD (gastroesophageal reflux disease)   . Hiatal hernia 12/22/10    egd by Dr. Gala Romney  . Hypertension   . 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 Gala Romney  . Myocardial infarct ([place]) 06/2019  . Portal hypertensive gastropathy   . PTSD (post-traumatic stress disorder)     Past Surgical  History:  Procedure Laterality Date  . ESOPHAGOGASTRODUODENOSCOPY  12/22/2010   Procedure: ESOPHAGOGASTRODUODENOSCOPY (EGD);  Surgeon: Daneil Dolin, MD;  [facility];  Service: Endoscopy;  Laterality: N/A;.    ERE, M-W tear, gastric erosions, bx benign, minimal esophageal variecs, portal gastropathy  . FOOT SURGERY    . NASAL RECONSTRUCTION  2003    Current Outpatient Medications  Medication Sig Dispense Refill  . acetaminophen (TYLENOL) 325 MG tablet Take 2 tablets (650 mg total) by mouth every 6 (six) hours as needed for mild pain (or Fever >/= 101).    Marland Kitchen amLODipine (NORVASC) 5 MG tablet Take 1 tablet (5 mg total) by mouth daily. 90 tablet 3  . atorvastatin (LIPITOR) 10 MG tablet Take 1 tablet (10 mg total) by mouth daily at 6 PM. 30 tablet 0  . blood glucose meter kit and supplies Dispense based on patient and insurance preference. Use up to four times daily as directed. (FOR ICD-10 E10.9, E11.9). 1 each 0  . metFORMIN (GLUCOPHAGE XR) 500 MG 24 hr tablet Take 1 tablet (500 mg total) by mouth 2 (two) times daily with a meal. 60 tablet 1  . pantoprazole (PROTONIX) 40 MG tablet Take 1 tablet (40 mg total) by mouth daily. 30 tablet 1  . spironolactone (ALDACTONE) 25 MG tablet Take 1 tablet (25 mg total) by mouth daily. 90 tablet 3   No current facility-administered medications for  this visit.   Allergies:  Patient has no known allergies.   Social History: The patient  reports that he has been smoking cigarettes. He has been smoking about 0.25 packs per day. He has never used smokeless tobacco. He reports current alcohol use of about 15.0 standard drinks of alcohol per week. He reports current drug use. Drugs: Marijuana, Cocaine, and IV.   Family History: The patient's family history includes Diabetes in his father and maternal grandmother; Hypertension in his father and paternal grandmother.   ROS:  Please see the history of present illness. Otherwise, complete review of systems  is positive for {NONE DEFAULTED:18576::"none"}.  All other systems are reviewed and negative.   Physical Exam: VS:  There were no vitals taken for this visit., BMI There is no height or weight on file to calculate BMI.  Wt Readings from Last 3 Encounters:  08/28/19 280 lb (127 kg)  06/13/19 279 lb 12.8 oz (126.9 kg)  06/11/19 282 lb (127.9 kg)    General: Patient appears comfortable at rest. HEENT: Conjunctiva and lids normal, oropharynx clear with moist mucosa. Neck: Supple, no elevated JVP or carotid bruits, no thyromegaly. Lungs: Clear to auscultation, nonlabored breathing at rest. Cardiac: Regular rate and rhythm, no S3 or significant systolic murmur, no pericardial rub. Abdomen: Soft, nontender, no hepatomegaly, bowel sounds present, no guarding or rebound. Extremities: No pitting edema, distal pulses 2+. Skin: Warm and dry. Musculoskeletal: No kyphosis. Neuropsychiatric: Alert and oriented x3, affect grossly appropriate.  ECG:  {EKG/Telemetry Strips Reviewed:[phone]}  Recent Labwork: 05/23/2019: B Natriuretic Peptide 66.0; Magnesium 2.3 08/28/2019: ALT 19; AST 15; BUN 13; Creatinine, Ser 0.99; Hemoglobin 14.6; Platelets 219; Potassium 4.0; Sodium 140; TSH 0.460     Component Value Date/Time   CHOL 143 05/24/2019 0617   TRIG 119 05/24/2019 0617   HDL 39 (L) 05/24/2019 0617   CHOLHDL 3.7 05/24/2019 0617   VLDL 24 05/24/2019 0617   Industry 80 05/24/2019 0617    Other Studies Reviewed Today:   Assessment and Plan:  No diagnosis found.   Medication Adjustments/Labs and Tests Ordered: Current medicines are reviewed at length with the patient today.  Concerns regarding medicines are outlined above.   Disposition: Follow-up with ***  Signed, Levell July, NP 09/08/2019 11:48 PM    [facility] [place], [place], [place] [address] Phone: [phone]; Fax: [phone]

## 2019-09-09 ENCOUNTER — Ambulatory Visit: Payer: BC Managed Care – PPO | Admitting: Family Medicine

## 2019-09-09 ENCOUNTER — Encounter: Payer: Self-pay | Admitting: Family Medicine

## 2019-09-24 ENCOUNTER — Ambulatory Visit: Payer: BC Managed Care – PPO | Admitting: Gastroenterology

## 2019-09-24 ENCOUNTER — Encounter: Payer: Self-pay | Admitting: Gastroenterology

## 2019-10-09 ENCOUNTER — Ambulatory Visit: Payer: BC Managed Care – PPO | Admitting: Cardiovascular Disease

## 2019-10-24 ENCOUNTER — Encounter: Payer: Self-pay | Admitting: Physician Assistant

## 2019-10-24 NOTE — Progress Notes (Deleted)
Cardiology Office Note    Date:  10/24/2019   ID:  Miguel Longs., DOB Dec 06, 1975, MRN [id number]  PCP:  Patient, No Pcp Per  Cardiologist:  Kate Sable, MD (Inactive)  Electrophysiologist:  None   Chief Complaint: f/u chest pain  History of Present Illness:   Miguel Elliott. is a 44 y.o. male with history of anxiety, depression, PTSD, EtOH use, esophageal varices, Mallory-Weiss tear, gastric erosions, GERD, hiatal hernia, portal hypertensive gastropathy, hypertension, DM, substance abuse including cocaine and tobacco abuse who presents for 3 month follow-up. He saw Dr. Bronson Ing in 06/2019 for chest pain, coming off an admission for hypertensive urgency with mildly elevated troponins of 79, 78, and 69. 2D echo 05/2019 showed EF 50-55%, grade 1 DD, normal PASP. Nuclear stress test was ordered but patient never had this done. More recently he was hospitalized in May 2021 with SVT in the setting of cocaine use. Troponins were minimally elevated and A1C was 7.3 reflective of diabetes, K of 3.4 and AKI with Cr of 1.38 which improved to 0.99  Cardizem   PSVT History of chest pain and abnormal troponin Hypertensive heart disease without heart failure (LVH + HTN) Polysubstance abuse  Labwork independently reviewed: 08/2019 TSH wnl, troponin 01-31-60, A1C 7.3, Cr 1.38->0.99, K 4.0, LFTs wnl, CBC wnl 05/2019 LDL 80, Mg 2.3   Past Medical History:  Diagnosis Date  . Abnormal weight loss    30 pounds since 11/2010  . Anxiety   . Cocaine use   . Depression    no medication  . Diabetes mellitus ([place])   . Esophageal varices (HCC) 12/22/10    grade 1 egd by Dr. Gala Romney  . Gastric erosions 12/22/10    egd by Dr. Gala Romney  . GERD (gastroesophageal reflux disease)   . Hiatal hernia 12/22/10    egd by Dr. Gala Romney  . Hypertension   . Hypertensive heart disease without CHF   . 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 Gala Romney  . Myocardial infarct ([place]) 06/2019  . Portal hypertensive  gastropathy   . PTSD (post-traumatic stress disorder)   . SVT (supraventricular tachycardia) ([place])   . Tobacco abuse     Past Surgical History:  Procedure Laterality Date  . ESOPHAGOGASTRODUODENOSCOPY  12/22/2010   Procedure: ESOPHAGOGASTRODUODENOSCOPY (EGD);  Surgeon: Daneil Dolin, MD;  [facility];  Service: Endoscopy;  Laterality: N/A;.    ERE, M-W tear, gastric erosions, bx benign, minimal esophageal variecs, portal gastropathy  . FOOT SURGERY    . NASAL RECONSTRUCTION  2003    Current Medications: No outpatient medications have been marked as taking for the 10/27/19 encounter (Appointment) with Charlie Pitter, PA-C.   ***   Allergies:   Patient has no known allergies.   Social History   Socioeconomic History  . Marital status: Single    Spouse name: Not on file  . Number of children: 2  . Years of education: Not on file  . Highest education level: Not on file  Occupational History  . Occupation: unemployed  Tobacco Use  . Smoking status: Current Every Day Smoker    Packs/day: 0.25    Types: Cigarettes  . Smokeless tobacco: Never Used  Vaping Use  . Vaping Use: Never used  Substance and Sexual Activity  . Alcohol use: Yes    Alcohol/week: 15.0 standard drinks    Types: 15 Shots of liquor per week    Comment: Drinks a fifth a week-  pt denied 06/13/19 "none past 3 weeks"  . Drug use: Yes    Types: Marijuana, Cocaine, IV    Comment: last used in 08/27/2019  . Sexual activity: Yes  Other Topics Concern  . Not on file  Social History Narrative  . Not on file   Social Determinants of Health   Financial Resource Strain:   . Difficulty of Paying Living Expenses:   Food Insecurity:   . Worried About Charity fundraiser in the Last Year:   . Arboriculturist in the Last Year:   Transportation Needs:   . Film/video editor (Medical):   Marland Kitchen Lack of Transportation (Non-Medical):   Physical Activity:   . Days of Exercise per Week:   . Minutes of  Exercise per Session:   Stress:   . Feeling of Stress :   Social Connections:   . Frequency of Communication with Friends and Family:   . Frequency of Social Gatherings with Friends and Family:   . Attends Religious Services:   . Active Member of Clubs or Organizations:   . Attends Archivist Meetings:   Marland Kitchen Marital Status:      Family History:  The patient's ***family history includes Diabetes in his father and maternal grandmother; Hypertension in his father and paternal grandmother. There is no history of Colon cancer, Ulcers, or Colon polyps.  ROS:   Please see the history of present illness. Otherwise, review of systems is positive for ***.  All other systems are reviewed and otherwise negative.    EKGs/Labs/Other Studies Reviewed:    Studies reviewed are outlined and summarized above. Reports included below if pertinent.  2D echo 05/2019 1. Left ventricular ejection fraction, by estimation, is 50 to 55%. The  left ventricle has low normal function. The left ventricle has no regional  wall motion abnormalities. There is moderate left ventricular hypertrophy.  Left ventricular diastolic  parameters are consistent with Grade I diastolic dysfunction (impaired  relaxation).  2. Right ventricular systolic function is normal. The right ventricular  size is normal. There is normal pulmonary artery systolic pressure. The  estimated right ventricular systolic pressure is 67.8 mmHg.  3. The mitral valve is grossly normal. Trivial mitral valve  regurgitation.  4. The aortic valve is tricuspid. Aortic valve regurgitation is not  visualized.  5. The inferior vena cava is normal in size with greater than 50%  respiratory variability, suggesting right atrial pressure of 3 mmHg.     EKG:  EKG is ordered today, personally reviewed, demonstrating ***  Recent Labs: 05/23/2019: B Natriuretic Peptide 66.0; Magnesium 2.3 08/28/2019: ALT 19; BUN 13; Creatinine, Ser 0.99;  Hemoglobin 14.6; Platelets 219; Potassium 4.0; Sodium 140; TSH 0.460  Recent Lipid Panel    Component Value Date/Time   CHOL 143 05/24/2019 0617   TRIG 119 05/24/2019 0617   HDL 39 (L) 05/24/2019 0617   CHOLHDL 3.7 05/24/2019 0617   VLDL 24 05/24/2019 0617   [place] 80 05/24/2019 0617    PHYSICAL EXAM:    VS:  There were no vitals taken for this visit.  BMI: There is no height or weight on file to calculate BMI.  GEN: Well nourished, well developed, in no acute distress HEENT: normocephalic, atraumatic Neck: no JVD, carotid bruits, or masses Cardiac: ***RRR; no murmurs, rubs, or gallops, no edema  Respiratory:  clear to auscultation bilaterally, normal work of breathing GI: soft, nontender, nondistended, + BS MS: no deformity or atrophy Skin: warm and dry,  no rash Neuro:  Alert and Oriented x 3, Strength and sensation are intact, follows commands Psych: euthymic mood, full affect  Wt Readings from Last 3 Encounters:  08/28/19 280 lb (127 kg)  06/13/19 279 lb 12.8 oz (126.9 kg)  06/11/19 282 lb (127.9 kg)     ASSESSMENT & PLAN:   1. ***  Disposition: F/u with ***   Medication Adjustments/Labs and Tests Ordered: Current medicines are reviewed at length with the patient today.  Concerns regarding medicines are outlined above. Medication changes, Labs and Tests ordered today are summarized above and listed in the Patient Instructions accessible in Encounters.   Signed, Charlie Pitter, PA-C  10/24/2019 5:17 PM    [facility] Location in [place], [place] [address] Ph: [phone]; Fax [phone]

## 2019-10-27 ENCOUNTER — Ambulatory Visit: Payer: BC Managed Care – PPO | Admitting: Physician Assistant

## 2019-10-27 DIAGNOSIS — Z87898 Personal history of other specified conditions: Secondary | ICD-10-CM

## 2019-10-27 DIAGNOSIS — I119 Hypertensive heart disease without heart failure: Secondary | ICD-10-CM

## 2019-10-27 DIAGNOSIS — I471 Supraventricular tachycardia: Secondary | ICD-10-CM

## 2019-10-27 DIAGNOSIS — F1910 Other psychoactive substance abuse, uncomplicated: Secondary | ICD-10-CM

## 2020-01-16 ENCOUNTER — Other Ambulatory Visit: Payer: Self-pay

## 2020-01-16 ENCOUNTER — Emergency Department (HOSPITAL_COMMUNITY)
Admission: EM | Admit: 2020-01-16 | Discharge: 2020-01-17 | Disposition: A | Discharge status: Home / Self Care | Payer: BC Managed Care – PPO | Attending: Emergency Medicine | Admitting: Emergency Medicine

## 2020-01-16 ENCOUNTER — Emergency Department (HOSPITAL_COMMUNITY): Payer: BC Managed Care – PPO

## 2020-01-16 ENCOUNTER — Encounter (HOSPITAL_COMMUNITY): Payer: Self-pay

## 2020-01-16 DIAGNOSIS — Z79899 Other long term (current) drug therapy: Secondary | ICD-10-CM | POA: Diagnosis not present

## 2020-01-16 DIAGNOSIS — I252 Old myocardial infarction: Secondary | ICD-10-CM | POA: Diagnosis not present

## 2020-01-16 DIAGNOSIS — R29898 Other symptoms and signs involving the musculoskeletal system: Secondary | ICD-10-CM

## 2020-01-16 DIAGNOSIS — Z7984 Long term (current) use of oral hypoglycemic drugs: Secondary | ICD-10-CM | POA: Insufficient documentation

## 2020-01-16 DIAGNOSIS — R202 Paresthesia of skin: Secondary | ICD-10-CM | POA: Diagnosis present

## 2020-01-16 DIAGNOSIS — I10 Essential (primary) hypertension: Secondary | ICD-10-CM | POA: Insufficient documentation

## 2020-01-16 DIAGNOSIS — R200 Anesthesia of skin: Secondary | ICD-10-CM

## 2020-01-16 DIAGNOSIS — R39198 Other difficulties with micturition: Secondary | ICD-10-CM | POA: Insufficient documentation

## 2020-01-16 DIAGNOSIS — F17210 Nicotine dependence, cigarettes, uncomplicated: Secondary | ICD-10-CM | POA: Diagnosis not present

## 2020-01-16 DIAGNOSIS — Z20822 Contact with and (suspected) exposure to covid-19: Secondary | ICD-10-CM | POA: Diagnosis not present

## 2020-01-16 DIAGNOSIS — E119 Type 2 diabetes mellitus without complications: Secondary | ICD-10-CM | POA: Insufficient documentation

## 2020-01-16 LAB — CBC WITH DIFFERENTIAL/PLATELET
Abs Immature Granulocytes: 0.02 10*3/uL (ref 0.00–0.07)
Basophils Absolute: 0 10*3/uL (ref 0.0–0.1)
Basophils Relative: 0 %
Eosinophils Absolute: 0.1 10*3/uL (ref 0.0–0.5)
Eosinophils Relative: 1 %
HCT: 48 % (ref 39.0–52.0)
Hemoglobin: 15.6 g/dL (ref 13.0–17.0)
Immature Granulocytes: 0 %
Lymphocytes Relative: 25 %
Lymphs Abs: 2 10*3/uL (ref 0.7–4.0)
MCH: 30.2 pg (ref 26.0–34.0)
MCHC: 32.5 g/dL (ref 30.0–36.0)
MCV: 92.8 fL (ref 80.0–100.0)
Monocytes Absolute: 0.7 10*3/uL (ref 0.1–1.0)
Monocytes Relative: 9 %
Neutro Abs: 5.1 10*3/uL (ref 1.7–7.7)
Neutrophils Relative %: 65 %
Platelets: 274 10*3/uL (ref 150–400)
RBC: 5.17 MIL/uL (ref 4.22–5.81)
RDW: 14 % (ref 11.5–15.5)
WBC: 7.9 10*3/uL (ref 4.0–10.5)
nRBC: 0 % (ref 0.0–0.2)

## 2020-01-16 LAB — BASIC METABOLIC PANEL
Anion gap: 10 (ref 5–15)
BUN: 15 mg/dL (ref 6–20)
CO2: 25 mmol/L (ref 22–32)
Calcium: 9.3 mg/dL (ref 8.9–10.3)
Chloride: 104 mmol/L (ref 98–111)
Creatinine, Ser: 1.23 mg/dL (ref 0.61–1.24)
GFR, Estimated: >60 mL/min (ref >60)
Glucose, Bld: 128 mg/dL — ABNORMAL HIGH (ref 70–99)
Potassium: 3.8 mmol/L (ref 3.5–5.1)
Sodium: 139 mmol/L (ref 135–145)

## 2020-01-16 LAB — CBG MONITORING, ED: Glucose-Capillary: 144 mg/dL — ABNORMAL HIGH (ref 70–99)

## 2020-01-16 NOTE — ED Triage Notes (Signed)
Pt arrives via POV c/o left arm and left leg numbness X 2 weeks. Pt able to ambulate to Triage w/o complication. Pt also reports having difficulty making urine and has been having kidney pain lately.

## 2020-01-16 NOTE — ED Provider Notes (Signed)
[facility] EMERGENCY DEPARTMENT Provider Note   CSN: [id number] Arrival date & time: 01/16/20  1828   Time seen 11:13 PM  History Chief Complaint  Patient presents with  . Numbness    Miguel Elliott. is a 44 y.o. male.  HPI Patient states he has been having episodes that come and go over the last 2 weeks where he gets numbness in his left arm from the fingertips up to the shoulder in stocking glove distribution also with numbness in the left temporal area of his face and numbness and weakness of his legs bilaterally.  He also complains that his legs get swollen.  He states also during his episodes he has drooling from his mouth.  He has not looked in the mirror to see if his face looks different.  He states he is able to walk but he feels weaker and he has to grab onto something to keep from falling.  He also feels like during this episode he cannot use his left arm at all.  He also states he has a constant left temporal headache and sometimes located posteriorly that is constant for the past 2 weeks and described as a pressure he also states his vision has been worse and that he has noticed he cannot see the numbers on a cell phone anymore.  Family history patient denies any family history of strokes.  He states his doctor added to blood pressure medications in August.  She also increase the dose of his other medications.  Patient also states she has been having difficulty urinating and he has only dribbling.  He also complains of flank pain bilaterally.  PCP Dr Yong Channel at [facility]     Past Medical History:  Diagnosis Date  . Abnormal weight loss    30 pounds since 11/2010  . Anxiety   . Cocaine use   . Depression    no medication  . Diabetes mellitus ([place])   . Esophageal varices (HCC) 12/22/10    grade 1 egd by Dr. Gala Romney  . Gastric erosions 12/22/10    egd by Dr. Gala Romney  . GERD (gastroesophageal reflux disease)   . Hiatal hernia 12/22/10    egd by Dr. Gala Romney  .  Hypertension   . Hypertensive heart disease without CHF   . 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 Gala Romney  . Myocardial infarct ([place]) 06/2019  . Portal hypertensive gastropathy   . PTSD (post-traumatic stress disorder)   . SVT (supraventricular tachycardia) ([place])   . Tobacco abuse     Patient Active Problem List   Diagnosis Date Noted  . SVT (supraventricular tachycardia) ([place]) 08/28/2019  . Cocaine use 08/28/2019  . Liver lesion 06/13/2019  . Polysubstance abuse ([place]) 05/24/2019  . Type 2 diabetes mellitus ([place]) 05/24/2019  . Chest pain 05/23/2019  . Hypertension   . GERD (gastroesophageal reflux disease)   . Anxiety with depression   . Hyperglycemia   . Morbid obesity ([place])   . Abdominal pain 01/17/2011  . Weight loss, abnormal 01/17/2011  . Vomiting 01/17/2011  . Hematemesis 12/22/2010  . Hypertensive urgency 12/22/2010  . Headache(784.0) 12/22/2010  . Dehydration 12/22/2010  . Hematuria, microscopic 12/22/2010    Past Surgical History:  Procedure Laterality Date  . ESOPHAGOGASTRODUODENOSCOPY  12/22/2010   Procedure: ESOPHAGOGASTRODUODENOSCOPY (EGD);  Surgeon: Daneil Dolin, MD;  Location[facility];  Service: Endoscopy;  Laterality: N/A;.    ERE, M-W tear, gastric erosions, bx benign, minimal  esophageal variecs, portal gastropathy  . FOOT SURGERY    . NASAL RECONSTRUCTION  2003       Family History  Problem Relation Age of Onset  . Hypertension Father   . Diabetes Father   . Hypertension Paternal Grandmother   . Diabetes Maternal Grandmother   . Colon cancer Neg Hx   . Ulcers Neg Hx   . Colon polyps Neg Hx     Social History   Tobacco Use  . Smoking status: Current Every Day Smoker    Packs/day: 0.25    Types: Cigarettes  . Smokeless tobacco: Never Used  Vaping Use  . Vaping Use: Never used  Substance Use Topics  . Alcohol use: Yes    Alcohol/week: 15.0 standard drinks    Types: 15 Shots of liquor per week    Comment: Drinks a fifth a  week- pt denied 06/13/19 "none past 3 weeks"  . Drug use: Yes    Types: Marijuana, Cocaine, IV    Comment: last used in 08/27/2019    Home Medications Prior to Admission medications   Medication Sig Start Date End Date Taking? Authorizing Provider  acetaminophen (TYLENOL) 325 MG tablet Take 2 tablets (650 mg total) by mouth every 6 (six) hours as needed for mild pain (or Fever >/= 101). 05/24/19   Johnson, Clanford L, MD  amLODipine (NORVASC) 5 MG tablet Take 1 tablet (5 mg total) by mouth daily. 06/11/19 09/09/19  Herminio Commons, MD  atorvastatin (LIPITOR) 10 MG tablet Take 1 tablet (10 mg total) by mouth daily at 6 PM. 05/24/19   Murlean Iba, MD  blood glucose meter kit and supplies Dispense based on patient and insurance preference. Use up to four times daily as directed. (FOR ICD-10 E10.9, E11.9). 05/24/19   Wynetta Emery, Clanford L, MD  metFORMIN (GLUCOPHAGE XR) 500 MG 24 hr tablet Take 1 tablet (500 mg total) by mouth 2 (two) times daily with a meal. 05/24/19 06/23/19  Johnson, Clanford L, MD  pantoprazole (PROTONIX) 40 MG tablet Take 1 tablet (40 mg total) by mouth daily. 05/25/19   Johnson, Clanford L, MD  spironolactone (ALDACTONE) 25 MG tablet Take 1 tablet (25 mg total) by mouth daily. 06/11/19 09/09/19  Herminio Commons, MD  Norvasc 10 mg a day, atorvastatin, Metformin 24-hour 1000 mg 2 times a day, Aldactone 50 mg daily, lisinopril 10 mg daily, metoprolol 25 mg twice daily.  The metoprolol and lisinopril were started in August.  Allergies    Patient has no known allergies.  Review of Systems   Review of Systems  All other systems reviewed and are negative.   Physical Exam Updated Vital Signs BP (!) 165/91   Pulse 70   Temp 98.6 F (37 C) (Oral)   Resp 18   Ht '5\' 9"'  (1.753 m)   Wt 117.9 kg   SpO2 98%   BMI 38.40 kg/m   Physical Exam Vitals and nursing note reviewed.  Constitutional:      General: He is not in acute distress.    Appearance: Normal appearance. He  is obese.  HENT:     Head: Normocephalic and atraumatic.     Right Ear: External ear normal.     Left Ear: External ear normal.  Eyes:     Extraocular Movements: Extraocular movements intact.     Conjunctiva/sclera: Conjunctivae normal.     Pupils: Pupils are equal, round, and reactive to light.  Cardiovascular:     Rate and Rhythm: Normal rate  and regular rhythm.     Pulses: Normal pulses.     Heart sounds: Normal heart sounds.  Pulmonary:     Effort: Pulmonary effort is normal. No respiratory distress.     Breath sounds: Normal breath sounds.  Musculoskeletal:        General: No swelling. Normal range of motion.     Cervical back: Normal range of motion.     Right lower leg: No edema.     Left lower leg: No edema.  Skin:    General: Skin is warm and dry.     Findings: No rash.  Neurological:     General: No focal deficit present.     Mental Status: He is alert and oriented to person, place, and time.     Cranial Nerves: No cranial nerve deficit.     Comments: To me patient seems weak in all extremities compared to me.  He has weak his lower legs and his upper extremities.  But his strength would be rated as 4 out of 5.  His patellar reflexes are slightly depressed and 1+ equally.  Grips to me seem weak bilaterally.  There is no pronator drift.  Psychiatric:        Mood and Affect: Mood normal.        Behavior: Behavior normal.        Thought Content: Thought content normal.     ED Results / Procedures / Treatments   Labs (all labs ordered are listed, but only abnormal results are displayed) Results for orders placed or performed during the hospital encounter of 01/16/20  Respiratory Panel by RT PCR (Flu A&B, Covid) - Nasopharyngeal Swab   Specimen: Nasopharyngeal Swab  Result Value Ref Range   SARS Coronavirus 2 by RT PCR NEGATIVE NEGATIVE   Influenza A by PCR NEGATIVE NEGATIVE   Influenza B by PCR NEGATIVE NEGATIVE  Basic metabolic panel  Result Value Ref Range    Sodium 139 135 - 145 mmol/L   Potassium 3.8 3.5 - 5.1 mmol/L   Chloride 104 98 - 111 mmol/L   CO2 25 22 - 32 mmol/L   Glucose, Bld 128 (H) 70 - 99 mg/dL   BUN 15 6 - 20 mg/dL   Creatinine, Ser 1.23 0.61 - 1.24 mg/dL   Calcium 9.3 8.9 - 10.3 mg/dL   GFR, Estimated >60 >60 mL/min   Anion gap 10 5 - 15  CBC with Differential/Platelet  Result Value Ref Range   WBC 7.9 4.0 - 10.5 K/uL   RBC 5.17 4.22 - 5.81 MIL/uL   Hemoglobin 15.6 13.0 - 17.0 g/dL   HCT 48.0 39 - 52 %   MCV 92.8 80.0 - 100.0 fL   MCH 30.2 26.0 - 34.0 pg   MCHC 32.5 30.0 - 36.0 g/dL   RDW 14.0 11.5 - 15.5 %   Platelets 274 150 - 400 K/uL   nRBC 0.0 0.0 - 0.2 %   Neutrophils Relative % 65 %   Neutro Abs 5.1 1.7 - 7.7 K/uL   Lymphocytes Relative 25 %   Lymphs Abs 2.0 0.7 - 4.0 K/uL   Monocytes Relative 9 %   Monocytes Absolute 0.7 0.1 - 1.0 K/uL   Eosinophils Relative 1 %   Eosinophils Absolute 0.1 0.0 - 0.5 K/uL   Basophils Relative 0 %   Basophils Absolute 0.0 0.0 - 0.1 K/uL   Immature Granulocytes 0 %   Abs Immature Granulocytes 0.02 0.00 - 0.07 K/uL  Urinalysis, Routine w  reflex microscopic  Result Value Ref Range   Color, Urine YELLOW YELLOW   APPearance CLEAR CLEAR   Specific Gravity, Urine 1.021 1.005 - 1.030   pH 5.0 5.0 - 8.0   Glucose, UA NEGATIVE NEGATIVE mg/dL   Hgb urine dipstick SMALL (A) NEGATIVE   Bilirubin Urine NEGATIVE NEGATIVE   Ketones, ur NEGATIVE NEGATIVE mg/dL   Protein, ur NEGATIVE NEGATIVE mg/dL   Nitrite NEGATIVE NEGATIVE   Leukocytes,Ua NEGATIVE NEGATIVE   RBC / HPF 0-5 0 - 5 RBC/hpf   WBC, UA 0-5 0 - 5 WBC/hpf   Bacteria, UA NONE SEEN NONE SEEN   Mucus PRESENT   Urine rapid drug screen (hosp performed)  Result Value Ref Range   Opiates NEGATIVE (A) NONE DETECTED   Cocaine POSITIVE (A) NONE DETECTED   Benzodiazepines NEGATIVE (A) NONE DETECTED   Amphetamines NEGATIVE (A) NONE DETECTED   Tetrahydrocannabinol POSITIVE (A) NONE DETECTED   Barbiturates NEGATIVE (A) NONE  DETECTED  CBG monitoring, ED  Result Value Ref Range   Glucose-Capillary 144 (H) 70 - 99 mg/dL   Comment 1 Notify RN    Laboratory interpretation all normal except hyperglycemia    EKG EKG Interpretation  Date/Time:  Friday January 16 2020 19:11:41 EDT Ventricular Rate:  86 PR Interval:  110 QRS Duration: 84 QT Interval:  370 QTC Calculation: 442 R Axis:   -4 Text Interpretation: Sinus rhythm with short PR Minimal voltage criteria for LVH, may be normal variant ( R in aVL ) Septal infarct , age undetermined T wave abnormality, consider inferior ischemia Since last tracing rate slower ST no longer depressed in Inferolateral leads T wave inversion now evident in Inferolateral leads Confirmed by Rolland Porter [phone]) on 01/16/2020 11:07:04 PM   Radiology CT Head Wo Contrast  CT Cervical Spine Wo Contrast  Result Date: 01/17/2020 CLINICAL DATA:  Left arm and leg numbness for 2 weeks EXAM: CT HEAD WITHOUT CONTRAST CT CERVICAL SPINE WITHOUT CONTRAST TECHNIQUE: Multidetector CT imaging of the head and cervical spine was performed following the standard protocol without intravenous contrast. Multiplanar CT image reconstructions of the cervical spine were also generated. COMPARISON:  None. FINDINGS: CT HEAD FINDINGS Brain: There is no mass, hemorrhage or extra-axial collection. The size and configuration of the ventricles and extra-axial CSF spaces are normal. The brain parenchyma is normal, without evidence of acute or chronic infarction. Vascular: No abnormal hyperdensity of the major intracranial arteries or dural venous sinuses. No intracranial atherosclerosis. Skull: The visualized skull base, calvarium and extracranial soft tissues are normal. Sinuses/Orbits: No fluid levels or advanced mucosal thickening of the visualized paranasal sinuses. No mastoid or middle ear effusion. The orbits are normal. CT CERVICAL SPINE FINDINGS Alignment: No static subluxation. Facets are aligned. Occipital  condyles are normally positioned. Skull base and vertebrae: No acute fracture. Soft tissues and spinal canal: No prevertebral fluid or swelling. No visible canal hematoma. Disc levels: No advanced spinal canal or neural foraminal stenosis. Upper chest: No pneumothorax, pulmonary nodule or pleural effusion. Other: Normal visualized paraspinal cervical soft tissues. IMPRESSION: 1. No acute intracranial abnormality. 2. No acute fracture or static subluxation of the cervical spine. Electronically Signed   By: Ulyses Jarred M.D.   On: 01/17/2020 01:34    Procedures .Critical Care Performed by: Rolland Porter, MD Authorized by: Rolland Porter, MD   Critical care provider statement:    Critical care time (minutes):  37   Critical care was necessary to treat or prevent imminent or life-threatening deterioration of the  following conditions:  CNS failure or compromise   Critical care was time spent personally by me on the following activities:  Discussions with consultants, examination of patient, obtaining history from patient or surrogate, ordering and review of laboratory studies, ordering and review of radiographic studies, pulse oximetry and re-evaluation of patient's condition   (including critical care time)  Medications Ordered in ED Medications - No data to display  ED Course  I have reviewed the triage vital signs and the nursing notes.  Pertinent labs & imaging results that were available during my care of the patient were reviewed by me and considered in my medical decision making (see chart for details).    MDM Rules/Calculators/A&P                           11:41 PM Teleneurology: nurse manager, discussed reason for consult will have neurologist evaluate in about 10 minutes.   12:13 AM Dr Mike Craze, Teleneurology, has evaluated patient and he states if the CT of the brain and cervical spine are normal he feels like patient needs MRI evaluation of those areas.  His concern is stated is that he  has had several strokes versus a demyelinating disease.  He feels like patient should not be evaluated as an outpatient.  Patient's bladder scan was 191.  03:30 AM Dr French Ana, discussed need for transfer but charge nurse in the background said they couldn't accept patient.   03:36 AM Dr Ignacia Felling accepts in transfer to [facility] long ED to get MRI done when they come in to the hospital this morning.  I have already placed the order for the MRI of the brain and cervical spine with and without.  Patient was made aware of need to transfer to [facility] long hospital in [place] to get his MRI studies done.  Patient states he has had 2 more episodes of numbness since I saw him initially in the ED.  5:00 AM [facility] is here to transport patient.  After patient has his MRI the decision can be made whether he needs to be admitted or not.   Final Clinical Impression(s) / ED Diagnoses Final diagnoses:  Numbness and tingling in left arm  Numbness and tingling of both lower extremities  Numbness and tingling of left side of face    Rx / DC Orders  Patient was transferred to [facility] long ED to get his MRI.  Rolland Porter, MD, Barbette Or, MD 01/17/20 0530

## 2020-01-17 ENCOUNTER — Emergency Department (HOSPITAL_COMMUNITY): Payer: BC Managed Care – PPO

## 2020-01-17 DIAGNOSIS — Z20822 Contact with and (suspected) exposure to covid-19: Secondary | ICD-10-CM | POA: Diagnosis not present

## 2020-01-17 DIAGNOSIS — R39198 Other difficulties with micturition: Secondary | ICD-10-CM | POA: Diagnosis not present

## 2020-01-17 DIAGNOSIS — I10 Essential (primary) hypertension: Secondary | ICD-10-CM | POA: Diagnosis not present

## 2020-01-17 DIAGNOSIS — F17210 Nicotine dependence, cigarettes, uncomplicated: Secondary | ICD-10-CM | POA: Diagnosis not present

## 2020-01-17 DIAGNOSIS — R202 Paresthesia of skin: Secondary | ICD-10-CM | POA: Diagnosis present

## 2020-01-17 DIAGNOSIS — R200 Anesthesia of skin: Secondary | ICD-10-CM | POA: Diagnosis not present

## 2020-01-17 DIAGNOSIS — Z7984 Long term (current) use of oral hypoglycemic drugs: Secondary | ICD-10-CM | POA: Diagnosis not present

## 2020-01-17 DIAGNOSIS — E119 Type 2 diabetes mellitus without complications: Secondary | ICD-10-CM | POA: Diagnosis not present

## 2020-01-17 DIAGNOSIS — I252 Old myocardial infarction: Secondary | ICD-10-CM | POA: Diagnosis not present

## 2020-01-17 DIAGNOSIS — R29898 Other symptoms and signs involving the musculoskeletal system: Secondary | ICD-10-CM | POA: Diagnosis not present

## 2020-01-17 DIAGNOSIS — Z79899 Other long term (current) drug therapy: Secondary | ICD-10-CM | POA: Diagnosis not present

## 2020-01-17 LAB — URINALYSIS, ROUTINE W REFLEX MICROSCOPIC
Bacteria, UA: NONE SEEN
Bilirubin Urine: NEGATIVE
Glucose, UA: NEGATIVE mg/dL
Ketones, ur: NEGATIVE mg/dL
Leukocytes,Ua: NEGATIVE
Nitrite: NEGATIVE
Protein, ur: NEGATIVE mg/dL
Specific Gravity, Urine: 1.021 (ref 1.005–1.030)
pH: 5 (ref 5.0–8.0)

## 2020-01-17 LAB — RAPID URINE DRUG SCREEN, HOSP PERFORMED
Amphetamines: NEGATIVE — AB
Barbiturates: NEGATIVE — AB
Benzodiazepines: NEGATIVE — AB
Cocaine: POSITIVE — AB
Opiates: NEGATIVE — AB
Tetrahydrocannabinol: POSITIVE — AB

## 2020-01-17 LAB — RESPIRATORY PANEL BY RT PCR (FLU A&B, COVID)
Influenza A by PCR: NEGATIVE
Influenza B by PCR: NEGATIVE
SARS Coronavirus 2 by RT PCR: NEGATIVE

## 2020-01-17 MED ORDER — GADOBUTROL 1 MMOL/ML IV SOLN
10.0000 mL | Freq: Once | INTRAVENOUS | Status: AC | PRN
  Administered 2020-01-17: 10 mL via INTRAVENOUS

## 2020-01-17 NOTE — ED Provider Notes (Signed)
Received patient has a handoff at shift change from [place] PA-C and Rolland Porter MD.    In short, patient with history of HTN, PTSD, MI, DM, and polysubstance abuse including cocaine who presented to the ED at [facility] with a 2-week history of numbness to left arm and fingertips as well as left-sided temporal area/face.  He also endorses weakness in lower extremities bilaterally.  Patient noted that during one of his episodes, he was drooling, but without any obvious facial droop.  He endorses weakness and left-sided temporal headache.  He is also endorsing progressively worsening vision symptoms and states that he can no longer see numbers on his cell phone.  No family history of CVA.  He has had recent adjustments to his BP medication.  Dr. Tomi Bamberger noticed weakness in extremities and slightly depressed patellar reflexes.  No pronator drift.  CT head without any acute intracranial abnormality or space-occupying lesions.  Teleneurology, Dr. Mike Craze, was consulted who recommends MRI of brain and cervical spine to evaluate for CVA versus demyelinating disease.  Patient referral made to transfer patient to [facility] for MRI studies.  He was then evaluated by McDonald PA who provided MSE prior to imaging.     Physical Exam  BP (!) 157/103 (BP Location: Right Arm)   Pulse 65   Temp (!) 97.4 F (36.3 C) (Oral)   Resp 17   Ht 5\' 9"  (1.753 m)   Wt 117.9 kg   SpO2 100%   BMI 38.40 kg/m   Physical Exam Vitals and nursing note reviewed. Exam conducted with a chaperone present.  Constitutional:      Appearance: Normal appearance.  HENT:     Head: Normocephalic and atraumatic.  Eyes:     General: No scleral icterus.    Conjunctiva/sclera: Conjunctivae normal.  Cardiovascular:     Rate and Rhythm: Normal rate and regular rhythm.     Pulses: Normal pulses.     Heart sounds: Normal heart sounds.  Pulmonary:     Effort: Pulmonary effort is normal. No respiratory distress.     Breath sounds: Normal  breath sounds. No wheezing.  Musculoskeletal:        General: Normal range of motion.  Skin:    General: Skin is dry.     Capillary Refill: Capillary refill takes less than 2 seconds.  Neurological:     General: No focal deficit present.     Mental Status: He is alert and oriented to person, place, and time.     GCS: GCS eye subscore is 4. GCS verbal subscore is 5. GCS motor subscore is 6.     Cranial Nerves: No cranial nerve deficit.     Sensory: No sensory deficit.     Motor: Weakness present.     Coordination: Coordination normal.     Gait: Gait normal.     Comments: PERRL and EOM intact.  No nystagmus.  Visual acuity grossly intact.  Left leg 3/5 compared to 5/5 right leg.  Neurovascularly intact.  Peripheral pulses intact and symmetric throughout.  Sensation intact throughout.  Negative Romberg and cerebellar exams.  Normal gait.  Psychiatric:        Mood and Affect: Mood normal.        Behavior: Behavior normal.        Thought Content: Thought content normal.     ED Course/Procedures     Procedures Results for orders placed or performed during the hospital encounter of 01/16/20  Respiratory Panel by  RT PCR (Flu A&B, Covid) - Nasopharyngeal Swab   Specimen: Nasopharyngeal Swab  Result Value Ref Range   SARS Coronavirus 2 by RT PCR NEGATIVE NEGATIVE   Influenza A by PCR NEGATIVE NEGATIVE   Influenza B by PCR NEGATIVE NEGATIVE  Basic metabolic panel  Result Value Ref Range   Sodium 139 135 - 145 mmol/L   Potassium 3.8 3.5 - 5.1 mmol/L   Chloride 104 98 - 111 mmol/L   CO2 25 22 - 32 mmol/L   Glucose, Bld 128 (H) 70 - 99 mg/dL   BUN 15 6 - 20 mg/dL   Creatinine, Ser 1.23 0.61 - 1.24 mg/dL   Calcium 9.3 8.9 - 10.3 mg/dL   GFR, Estimated >60 >60 mL/min   Anion gap 10 5 - 15  CBC with Differential/Platelet  Result Value Ref Range   WBC 7.9 4.0 - 10.5 K/uL   RBC 5.17 4.22 - 5.81 MIL/uL   Hemoglobin 15.6 13.0 - 17.0 g/dL   HCT 48.0 39 - 52 %   MCV 92.8 80.0 - 100.0 fL    MCH 30.2 26.0 - 34.0 pg   MCHC 32.5 30.0 - 36.0 g/dL   RDW 14.0 11.5 - 15.5 %   Platelets 274 150 - 400 K/uL   nRBC 0.0 0.0 - 0.2 %   Neutrophils Relative % 65 %   Neutro Abs 5.1 1.7 - 7.7 K/uL   Lymphocytes Relative 25 %   Lymphs Abs 2.0 0.7 - 4.0 K/uL   Monocytes Relative 9 %   Monocytes Absolute 0.7 0.1 - 1.0 K/uL   Eosinophils Relative 1 %   Eosinophils Absolute 0.1 0.0 - 0.5 K/uL   Basophils Relative 0 %   Basophils Absolute 0.0 0.0 - 0.1 K/uL   Immature Granulocytes 0 %   Abs Immature Granulocytes 0.02 0.00 - 0.07 K/uL  Urinalysis, Routine w reflex microscopic  Result Value Ref Range   Color, Urine YELLOW YELLOW   APPearance CLEAR CLEAR   Specific Gravity, Urine 1.021 1.005 - 1.030   pH 5.0 5.0 - 8.0   Glucose, UA NEGATIVE NEGATIVE mg/dL   Hgb urine dipstick SMALL (A) NEGATIVE   Bilirubin Urine NEGATIVE NEGATIVE   Ketones, ur NEGATIVE NEGATIVE mg/dL   Protein, ur NEGATIVE NEGATIVE mg/dL   Nitrite NEGATIVE NEGATIVE   Leukocytes,Ua NEGATIVE NEGATIVE   RBC / HPF 0-5 0 - 5 RBC/hpf   WBC, UA 0-5 0 - 5 WBC/hpf   Bacteria, UA NONE SEEN NONE SEEN   Mucus PRESENT   Urine rapid drug screen (hosp performed)  Result Value Ref Range   Opiates NEGATIVE (A) NONE DETECTED   Cocaine POSITIVE (A) NONE DETECTED   Benzodiazepines NEGATIVE (A) NONE DETECTED   Amphetamines NEGATIVE (A) NONE DETECTED   Tetrahydrocannabinol POSITIVE (A) NONE DETECTED   Barbiturates NEGATIVE (A) NONE DETECTED  CBG monitoring, ED  Result Value Ref Range   Glucose-Capillary 144 (H) 70 - 99 mg/dL   Comment 1 Notify RN    CT Head Wo Contrast  Result Date: 01/17/2020 CLINICAL DATA:  Left arm and leg numbness for 2 weeks EXAM: CT HEAD WITHOUT CONTRAST CT CERVICAL SPINE WITHOUT CONTRAST TECHNIQUE: Multidetector CT imaging of the head and cervical spine was performed following the standard protocol without intravenous contrast. Multiplanar CT image reconstructions of the cervical spine were also generated.  COMPARISON:  None. FINDINGS: CT HEAD FINDINGS Brain: There is no mass, hemorrhage or extra-axial collection. The size and configuration of the ventricles and extra-axial CSF spaces  are normal. The brain parenchyma is normal, without evidence of acute or chronic infarction. Vascular: No abnormal hyperdensity of the major intracranial arteries or dural venous sinuses. No intracranial atherosclerosis. Skull: The visualized skull base, calvarium and extracranial soft tissues are normal. Sinuses/Orbits: No fluid levels or advanced mucosal thickening of the visualized paranasal sinuses. No mastoid or middle ear effusion. The orbits are normal. CT CERVICAL SPINE FINDINGS Alignment: No static subluxation. Facets are aligned. Occipital condyles are normally positioned. Skull base and vertebrae: No acute fracture. Soft tissues and spinal canal: No prevertebral fluid or swelling. No visible canal hematoma. Disc levels: No advanced spinal canal or neural foraminal stenosis. Upper chest: No pneumothorax, pulmonary nodule or pleural effusion. Other: Normal visualized paraspinal cervical soft tissues. IMPRESSION: 1. No acute intracranial abnormality. 2. No acute fracture or static subluxation of the cervical spine. Electronically Signed   By: Ulyses Jarred M.D.   On: 01/17/2020 01:34   CT Cervical Spine Wo Contrast  Result Date: 01/17/2020 CLINICAL DATA:  Left arm and leg numbness for 2 weeks EXAM: CT HEAD WITHOUT CONTRAST CT CERVICAL SPINE WITHOUT CONTRAST TECHNIQUE: Multidetector CT imaging of the head and cervical spine was performed following the standard protocol without intravenous contrast. Multiplanar CT image reconstructions of the cervical spine were also generated. COMPARISON:  None. FINDINGS: CT HEAD FINDINGS Brain: There is no mass, hemorrhage or extra-axial collection. The size and configuration of the ventricles and extra-axial CSF spaces are normal. The brain parenchyma is normal, without evidence of acute or  chronic infarction. Vascular: No abnormal hyperdensity of the major intracranial arteries or dural venous sinuses. No intracranial atherosclerosis. Skull: The visualized skull base, calvarium and extracranial soft tissues are normal. Sinuses/Orbits: No fluid levels or advanced mucosal thickening of the visualized paranasal sinuses. No mastoid or middle ear effusion. The orbits are normal. CT CERVICAL SPINE FINDINGS Alignment: No static subluxation. Facets are aligned. Occipital condyles are normally positioned. Skull base and vertebrae: No acute fracture. Soft tissues and spinal canal: No prevertebral fluid or swelling. No visible canal hematoma. Disc levels: No advanced spinal canal or neural foraminal stenosis. Upper chest: No pneumothorax, pulmonary nodule or pleural effusion. Other: Normal visualized paraspinal cervical soft tissues. IMPRESSION: 1. No acute intracranial abnormality. 2. No acute fracture or static subluxation of the cervical spine. Electronically Signed   By: Ulyses Jarred M.D.   On: 01/17/2020 01:34   MR Brain W and Wo Contrast  Result Date: 01/17/2020 CLINICAL DATA:  Neuro deficit, stroke suspected. Numbness or tingling, paresthesia. EXAM: MRI HEAD WITHOUT AND WITH CONTRAST MRI CERVICAL SPINE WITHOUT AND WITH CONTRAST TECHNIQUE: Multiplanar, multiecho pulse sequences of the brain and surrounding structures, and cervical spine, to include the craniocervical junction and cervicothoracic junction, were obtained without and with intravenous contrast. CONTRAST:  27mL GADAVIST GADOBUTROL 1 MMOL/ML IV SOLN COMPARISON:  01/17/2020 head and cervical spine CT. 03/26/2001 cervical spine radiographs. FINDINGS: MRI HEAD FINDINGS Brain: No diffusion-weighted signal abnormality. No intracranial hemorrhage. No midline shift, ventriculomegaly or extra-axial fluid collection. No mass lesion. Cerebral volume is within normal limits. Minimal scattered supratentorial white matter T2/FLAIR hyperintensities. No  abnormal enhancement. Vascular: Normal flow voids. Skull and upper cervical spine: Normal marrow signal. Sinuses/Orbits: Normal orbits. Clear paranasal sinuses. No mastoid effusion. Other: None. MRI CERVICAL SPINE FINDINGS Alignment: Straightening of lordosis. Congenital spinal canal narrowing. Vertebrae: Normal bone marrow signal intensity. No focal osseous lesion. Cord: Normal signal and morphology.  No abnormal enhancement. Posterior Fossa, vertebral arteries: Please see MRI head. Disc levels: Multilevel  desiccation however disc spaces are preserved. C2-3: Left uncovertebral and facet degenerative spurring with mild left neural foraminal narrowing. Patent spinal canal and right neural foramen. C3-4: Bilateral uncovertebral and facet degenerative spurring. Mild spinal canal and bilateral neural foraminal narrowing. C4-5: Small central protrusion with uncovertebral and facet hypertrophy. Mild spinal canal and bilateral neural foraminal narrowing. C5-6: Right predominant uncovertebral and facet hypertrophy. Small right paracentral protrusion. Mild spinal canal and bilateral neural foraminal narrowing. C6-7: Disc osteophyte complex with uncovertebral and facet hypertrophy. Mild spinal canal and bilateral neural foraminal narrowing. C7-T1: No significant disc bulge. Patent spinal canal and neural foramen. Paraspinal tissues: Negative. IMPRESSION: MRI head: No acute intracranial process. Minimal supratentorial white matter T2/FLAIR hyperintense foci are nonspecific. Differential includes post infectious/inflammatory sequela, migraines, chronic microvascular ischemic changes and demyelination. MRI cervical spine: No focal cord lesion or abnormal enhancement. 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 Electronically Signed   By: Primitivo Gauze M.D.   On: 01/17/2020 10:52   MR Cervical Spine W or Wo Contrast  Result Date: 01/17/2020 CLINICAL DATA:  Neuro deficit, stroke  suspected. Numbness or tingling, paresthesia. EXAM: MRI HEAD WITHOUT AND WITH CONTRAST MRI CERVICAL SPINE WITHOUT AND WITH CONTRAST TECHNIQUE: Multiplanar, multiecho pulse sequences of the brain and surrounding structures, and cervical spine, to include the craniocervical junction and cervicothoracic junction, were obtained without and with intravenous contrast. CONTRAST:  89mL GADAVIST GADOBUTROL 1 MMOL/ML IV SOLN COMPARISON:  01/17/2020 head and cervical spine CT. 03/26/2001 cervical spine radiographs. FINDINGS: MRI HEAD FINDINGS Brain: No diffusion-weighted signal abnormality. No intracranial hemorrhage. No midline shift, ventriculomegaly or extra-axial fluid collection. No mass lesion. Cerebral volume is within normal limits. Minimal scattered supratentorial white matter T2/FLAIR hyperintensities. No abnormal enhancement. Vascular: Normal flow voids. Skull and upper cervical spine: Normal marrow signal. Sinuses/Orbits: Normal orbits. Clear paranasal sinuses. No mastoid effusion. Other: None. MRI CERVICAL SPINE FINDINGS Alignment: Straightening of lordosis. Congenital spinal canal narrowing. Vertebrae: Normal bone marrow signal intensity. No focal osseous lesion. Cord: Normal signal and morphology.  No abnormal enhancement. Posterior Fossa, vertebral arteries: Please see MRI head. Disc levels: Multilevel desiccation however disc spaces are preserved. C2-3: Left uncovertebral and facet degenerative spurring with mild left neural foraminal narrowing. Patent spinal canal and right neural foramen. C3-4: Bilateral uncovertebral and facet degenerative spurring. Mild spinal canal and bilateral neural foraminal narrowing. C4-5: Small central protrusion with uncovertebral and facet hypertrophy. Mild spinal canal and bilateral neural foraminal narrowing. C5-6: Right predominant uncovertebral and facet hypertrophy. Small right paracentral protrusion. Mild spinal canal and bilateral neural foraminal narrowing. C6-7: Disc  osteophyte complex with uncovertebral and facet hypertrophy. Mild spinal canal and bilateral neural foraminal narrowing. C7-T1: No significant disc bulge. Patent spinal canal and neural foramen. Paraspinal tissues: Negative. IMPRESSION: MRI head: No acute intracranial process. Minimal supratentorial white matter T2/FLAIR hyperintense foci are nonspecific. Differential includes post infectious/inflammatory sequela, migraines, chronic microvascular ischemic changes and demyelination. MRI cervical spine: No focal cord lesion or abnormal enhancement. 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 Electronically Signed   By: Primitivo Gauze M.D.   On: 01/17/2020 10:52    MDM   On my examination, patient was resting comfortably and in no acute distress.  He is hungry.  He states that he has been intermittently experiencing left-sided arm numbness in addition to his generalized weakness.  Patient is endorsing low back pain in addition to left leg weakness.  He also states that his lower legs bilaterally have had intermittent swelling.  I personally reviewed echocardiogram obtained 05/24/2019 which revealed minimally reduced ejection fraction of 50 to 55%.  He is followed by Dr. Bronson Ing.    I reviewed MRI obtained of head and cervical spine which demonstrates minimal supratentorial white matter T2/FLAIR hyperintense foci which are nonspecific, differential including postinfectious less inflammatory sequela versus microvascular ischemic changes versus demyelination.    Patient tested positive for cocaine here in the ED and admits that that may be a contributing factor.  Remainder of his laboratory work-up was entirely unremarkable.  I discussed case with Dr. Lorrin Goodell, neurology, who reviewed patient's MRI and was impressed.  However, given his objective left leg weakness on exam, recommending MRI with contrast of thoracic and lumbar spine to evaluate for cauda equina  versus epidural abscess versus demyelinating disease.  Patient had told hand-off provider that he was having difficulty urinating, but he has been seen by staff ambulate to the bathroom on multiple occasions here today to urinate.  Patient has been endorsing low back pain and given his history of substance abuse, cannot reliably conclude that he has not recently engaged in any IVDA.  I agree that patient would benefit from MRI imaging of lumbar and thoracic spine.  Unfortunately MRI would not be able to be repeated for 24 hours.  I discussed this with Dr. Lorrin Goodell who states that he could be re-scanned in 12 hours.  Engaged in [facility] and given chronicity of his symptoms, endorsing leg weakness x two weeks, lower suspicion for emergent pathology.  He denies any fevers/chills or IVDA.  Will obtain post-void residual to further assess for cauda equina.  If within normal limits, feel as though outpatient f/u with neurology would be appropriate.  Discussed with Dr. Maryan Rued who agrees with assessment and plan.    Corena Herter, PA-C 01/17/20 [id number]    Blanchie Dessert, MD 01/17/20 1948

## 2020-01-17 NOTE — Consult Note (Signed)
[facility] TeleNeurology Consult Services  Stat Consult  Date of Service:   01/16/2020 23:37:48  Impression:     .  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 I am concerned about multiple etiologies including strokes, demyelinating disease, cervical myelopathy etc. as cause of these episodes. I would recommend continuing him on aspirin. I would recommend getting an MRI of the head along with C-spine with and without contrast and neurology follow-up. Depending on the results of these tests, further care would be planned. I would recommend admitting him to the hospital for further work-up if possible.  CT HEAD: Not Reviewed Not done  Our recommendations are outlined below.  Diagnostic Studies: Recommend MRI brain without contrast  Laboratory Studies: Recommend Lipid panel Hemoglobin A1c  Medication: Antiplatelet Therapy recommended Statins for LDL goal less than 70  Nursing Recommendations: Telemetry, IV Fluids, avoid dextrose containing fluids, Maintain euglycemia Neuro checks q4 hrs x 24 hrs and then per shift Head of bed 30 degrees  Consultations: Recommend Speech therapy if failed dysphagia screen Physical therapy/Occupational therapy  DVT Prophylaxis: Choice of Primary Team  Disposition: Neurology will follow  Additional Recommendations:     Metrics: [facility] Notification Time: 01/16/2020 23:36:06 Stamp Time: 01/16/2020 23:37:48 Callback Response Time: 01/16/2020 23:38:29   ----------------------------------------------------------------------------------------------------  Chief Complaint: Left arm and both leg numbness  History of Present Illness: Patient is a 44 year old Male.  44 year old male with past medical history of hypertension, hyperlipidemia,  diabetes, coronary artery disease came to the hospital because of multiple symptoms. He reports that he is having some intermittent episodes of left arm and both leg numbness and also reports some numbness in his face at times. He reports these episodes are intermittent and have been going on for the past week or so. He also complains of some urinary retention. Denies any speech slurring. Denies any visual problems. Denies any syncopal episodes. Denies any gait or balance episodes.    Past Medical History:     . Hypertension     . Diabetes Mellitus     . Hyperlipidemia     . Coronary Artery Disease  Anticoagulant use:  No  Antiplatelet use: Aspirin     Examination: BP(182/81), Pulse(87), Blood Glucose(128) 1A: Level of Consciousness - Alert; keenly responsive + 0 1B: Ask Month and Age - Both Questions Right + 0 1C: Blink Eyes & Squeeze Hands - Performs Both Tasks + 0 2: Test Horizontal Extraocular Movements - Normal + 0 3: Test Visual Fields - No Visual Loss + 0 4: Test Facial Palsy (Use Grimace if Obtunded) - Normal symmetry + 0 5A: Test Left Arm Motor Drift - No Drift for 10 Seconds + 0 5B: Test Right Arm Motor Drift - No Drift for 10 Seconds + 0 6A: Test Left Leg Motor Drift - No Drift for 5 Seconds + 0 6B: Test Right Leg Motor Drift - No Drift for 5 Seconds + 0 7: Test Limb Ataxia (FNF/Heel-Shin) - No Ataxia + 0 8: Test Sensation - Normal; No sensory loss + 0 9: Test Language/Aphasia - Normal; No aphasia + 0 10: Test Dysarthria - Normal + 0 11: Test Extinction/Inattention - No abnormality + 0  NIHSS Score: 0   Patient/Family was informed the Neurology Consult would occur via TeleHealth consult by way of interactive audio and video telecommunications  and consented to receiving care in this manner.  Patient is being evaluated for possible acute neurologic impairment and high probability of imminent or life-threatening deterioration. I spent total of 20 minutes providing care  to this patient, including time for face to face visit via telemedicine, review of medical records, imaging studies and discussion of findings with providers, the patient and/or family.   Dr Faustino Congress   [facility] [phone]  Case [id number]

## 2020-01-17 NOTE — ED Notes (Signed)
Pt hs gone to MRI

## 2020-01-17 NOTE — Discharge Instructions (Addendum)
Please call your primary care provider regarding today's encounter and for ongoing evaluation and management.  Perhaps your primary care provider could check your B12 levels to help evaluate for deficiency-induced neuropathy which might explain your intermittent tingling and numbness sensation.  If your symptoms fail to improve, please call [facility] to schedule appointment for further work-up.   It is also important that you discontinue any and all cocaine use as this could exacerbate your symptoms, particularly given your reported concerns for heart disease.  This could certainly be contributing to your neurologic symptoms.  Your laboratory work-up was otherwise reassuring.  Please return to the ED or seek immediate medical attention if you are unable to urinate, develop fevers or chills, worsening neurologic deficits, or should you develop any other new or worsening symptoms.

## 2020-01-17 NOTE — ED Notes (Signed)
Pt is at MRI vitals will be done on return

## 2020-01-17 NOTE — ED Provider Notes (Signed)
44 year old male received as a transfer from Dr. Wynona Canes at [facility] for MRI.  Per her HPI:  "Patient states he has been having episodes that come and go over the last 2 weeks where he gets numbness in his left arm from the fingertips up to the shoulder in stocking glove distribution also with numbness in the left temporal area of his face and numbness and weakness of his legs bilaterally.  He also complains that his legs get swollen.  He states also during his episodes he has drooling from his mouth.  He has not looked in the mirror to see if his face looks different.  He states he is able to walk but he feels weaker and he has to grab onto something to keep from falling.  He also feels like during this episode he cannot use his left arm at all.  He also states he has a constant left temporal headache and sometimes located posteriorly that is constant for the past 2 weeks and described as a pressure he also states his vision has been worse and that he has noticed he cannot see the numbers on a cell phone anymore.  Family history patient denies any family history of strokes.  He states his doctor added to blood pressure medications in August.  She also increase the dose of his other medications.  Patient also states she has been having difficulty urinating and he has only dribbling.  He also complains of flank pain bilaterally.  PCP Dr Yong Channel at [facility]"  Physical Exam  BP (!) 167/90 (BP Location: Left Arm)   Pulse 68   Temp 97.7 F (36.5 C) (Oral)   Resp 17   Ht 5\' 9"  (1.753 m)   Wt 117.9 kg   SpO2 95%   BMI 38.40 kg/m   Physical Exam Vitals and nursing note reviewed.  Constitutional:      Appearance: He is well-developed.  HENT:     Head: Normocephalic.  Eyes:     Conjunctiva/sclera: Conjunctivae normal.  Cardiovascular:     Rate and Rhythm: Normal rate and regular rhythm.     Heart sounds: No murmur heard.   Pulmonary:     Effort: Pulmonary effort is normal.   Abdominal:     General: There is no distension.     Palpations: Abdomen is soft.     Comments: Abdomen soft, nontender, nondistended.  Musculoskeletal:     Cervical back: Neck supple.  Skin:    General: Skin is warm and dry.  Neurological:     Mental Status: He is alert.     Comments: Weak grip strength bilaterally.  Moves all 4 extremities spontaneously.  Alert and oriented x4.  Cranial nerves II through XII are grossly intact.  Psychiatric:        Behavior: Behavior normal.     ED Course/Procedures     Procedures  MDM   44 year old male received as a transfer from [facility] after being evaluated by Dr. Rolland Porter.  MRIs are ordered. Labs and previous imaging have been reviewed by me. He declines needing Ativan prior to MRIs.  Patient care transferred to PA Green at the end of my shift. Patient presentation, ED course, and plan of care discussed with review of all pertinent labs and imaging. Please see his/her note for further details regarding further ED course and disposition.       Joanne Gavel, PA-C 01/17/20 0834    Shanon Rosser, MD 01/17/20 2251

## 2020-01-17 NOTE — ED Notes (Signed)
Pt in bed watching tv, respirations even and unlabored, no needs at this time.

## 2020-01-17 NOTE — ED Notes (Signed)
[facility] called for transport. 

## 2020-01-17 NOTE — ED Notes (Signed)
Pt returned to room  

## 2020-03-05 ENCOUNTER — Emergency Department (HOSPITAL_COMMUNITY): Payer: BC Managed Care – PPO

## 2020-03-05 ENCOUNTER — Emergency Department (HOSPITAL_COMMUNITY)
Admission: EM | Admit: 2020-03-05 | Discharge: 2020-03-05 | Disposition: A | Discharge status: Home / Self Care | Payer: BC Managed Care – PPO | Attending: Emergency Medicine | Admitting: Emergency Medicine

## 2020-03-05 ENCOUNTER — Encounter (HOSPITAL_COMMUNITY): Payer: Self-pay | Admitting: Emergency Medicine

## 2020-03-05 ENCOUNTER — Other Ambulatory Visit: Payer: Self-pay

## 2020-03-05 DIAGNOSIS — S40021A Contusion of right upper arm, initial encounter: Secondary | ICD-10-CM | POA: Insufficient documentation

## 2020-03-05 DIAGNOSIS — Z7982 Long term (current) use of aspirin: Secondary | ICD-10-CM | POA: Insufficient documentation

## 2020-03-05 DIAGNOSIS — I10 Essential (primary) hypertension: Secondary | ICD-10-CM | POA: Insufficient documentation

## 2020-03-05 DIAGNOSIS — M79601 Pain in right arm: Secondary | ICD-10-CM

## 2020-03-05 DIAGNOSIS — F17210 Nicotine dependence, cigarettes, uncomplicated: Secondary | ICD-10-CM | POA: Diagnosis not present

## 2020-03-05 DIAGNOSIS — W450XXA Nail entering through skin, initial encounter: Secondary | ICD-10-CM | POA: Insufficient documentation

## 2020-03-05 DIAGNOSIS — Z79899 Other long term (current) drug therapy: Secondary | ICD-10-CM | POA: Diagnosis not present

## 2020-03-05 DIAGNOSIS — S4991XA Unspecified injury of right shoulder and upper arm, initial encounter: Secondary | ICD-10-CM | POA: Diagnosis present

## 2020-03-05 DIAGNOSIS — T148XXA Other injury of unspecified body region, initial encounter: Secondary | ICD-10-CM

## 2020-03-05 DIAGNOSIS — E1165 Type 2 diabetes mellitus with hyperglycemia: Secondary | ICD-10-CM | POA: Diagnosis not present

## 2020-03-05 DIAGNOSIS — Z7984 Long term (current) use of oral hypoglycemic drugs: Secondary | ICD-10-CM | POA: Diagnosis not present

## 2020-03-05 DIAGNOSIS — Y990 Civilian activity done for income or pay: Secondary | ICD-10-CM | POA: Diagnosis not present

## 2020-03-05 DIAGNOSIS — E1169 Type 2 diabetes mellitus with other specified complication: Secondary | ICD-10-CM | POA: Insufficient documentation

## 2020-03-05 DIAGNOSIS — Z23 Encounter for immunization: Secondary | ICD-10-CM | POA: Insufficient documentation

## 2020-03-05 LAB — CBC WITH DIFFERENTIAL/PLATELET
Abs Immature Granulocytes: 0.03 10*3/uL (ref 0.00–0.07)
Basophils Absolute: 0.1 10*3/uL (ref 0.0–0.1)
Basophils Relative: 1 %
Eosinophils Absolute: 0.1 10*3/uL (ref 0.0–0.5)
Eosinophils Relative: 1 %
HCT: 42.8 % (ref 39.0–52.0)
Hemoglobin: 13.8 g/dL (ref 13.0–17.0)
Immature Granulocytes: 0 %
Lymphocytes Relative: 23 %
Lymphs Abs: 1.9 10*3/uL (ref 0.7–4.0)
MCH: 30.5 pg (ref 26.0–34.0)
MCHC: 32.2 g/dL (ref 30.0–36.0)
MCV: 94.7 fL (ref 80.0–100.0)
Monocytes Absolute: 0.8 10*3/uL (ref 0.1–1.0)
Monocytes Relative: 11 %
Neutro Abs: 5.2 10*3/uL (ref 1.7–7.7)
Neutrophils Relative %: 64 %
Platelets: 319 10*3/uL (ref 150–400)
RBC: 4.52 MIL/uL (ref 4.22–5.81)
RDW: 14.6 % (ref 11.5–15.5)
WBC: 8 10*3/uL (ref 4.0–10.5)
nRBC: 0 % (ref 0.0–0.2)

## 2020-03-05 LAB — COMPREHENSIVE METABOLIC PANEL
ALT: 18 U/L (ref 0–44)
AST: 16 U/L (ref 15–41)
Albumin: 3.7 g/dL (ref 3.5–5.0)
Alkaline Phosphatase: 62 U/L (ref 38–126)
Anion gap: 7 (ref 5–15)
BUN: 23 mg/dL — ABNORMAL HIGH (ref 6–20)
CO2: 24 mmol/L (ref 22–32)
Calcium: 8.6 mg/dL — ABNORMAL LOW (ref 8.9–10.3)
Chloride: 104 mmol/L (ref 98–111)
Creatinine, Ser: 1.1 mg/dL (ref 0.61–1.24)
GFR, Estimated: >60 mL/min (ref >60)
Glucose, Bld: 141 mg/dL — ABNORMAL HIGH (ref 70–99)
Potassium: 3.9 mmol/L (ref 3.5–5.1)
Sodium: 135 mmol/L (ref 135–145)
Total Bilirubin: 0.7 mg/dL (ref 0.3–1.2)
Total Protein: 6.9 g/dL (ref 6.5–8.1)

## 2020-03-05 LAB — PROTIME-INR
INR: 1 (ref 0.8–1.2)
Prothrombin Time: 13.2 seconds (ref 11.4–15.2)

## 2020-03-05 LAB — LACTIC ACID, PLASMA: Lactic Acid, Venous: 0.9 mmol/L (ref 0.5–1.9)

## 2020-03-05 MED ORDER — HYDROCODONE-ACETAMINOPHEN 5-325 MG PO TABS
1.0000 | ORAL_TABLET | Freq: Once | ORAL | Status: AC
  Administered 2020-03-05: 1 via ORAL
  Filled 2020-03-05: qty 1

## 2020-03-05 MED ORDER — DOXYCYCLINE HYCLATE 100 MG PO TABS
100.0000 mg | ORAL_TABLET | Freq: Once | ORAL | Status: AC
  Administered 2020-03-05: 100 mg via ORAL
  Filled 2020-03-05: qty 1

## 2020-03-05 MED ORDER — DOXYCYCLINE HYCLATE 100 MG PO CAPS: 100.0000 mg | ORAL_CAPSULE | Freq: Two times a day (BID) | ORAL | 0 refills | Status: DC

## 2020-03-05 MED ORDER — TETANUS-DIPHTH-ACELL PERTUSSIS 5-2.5-18.5 LF-MCG/0.5 IM SUSY
0.5000 mL | PREFILLED_SYRINGE | Freq: Once | INTRAMUSCULAR | Status: AC
  Administered 2020-03-05: 0.5 mL via INTRAMUSCULAR
  Filled 2020-03-05: qty 0.5

## 2020-03-05 MED ORDER — HYDROCODONE-ACETAMINOPHEN 5-325 MG PO TABS: 1.0000 | ORAL_TABLET | Freq: Four times a day (QID) | ORAL | 0 refills | Status: DC | PRN

## 2020-03-05 NOTE — ED Triage Notes (Signed)
Pt c/o right arm pain since last week when he hit it on a nail. Pt with significant bruising to lower arm.

## 2020-03-05 NOTE — Discharge Instructions (Signed)
Keep arm elevated.  Use medications as prescribed.  If you have new fever, worsening pain, worsening swelling in the arm please return to the ER

## 2020-03-05 NOTE — ED Provider Notes (Signed)
 EMERGENCY DEPARTMENT Provider Note   CSN: [id number] Arrival date & time: 03/05/20  0124     History Chief Complaint  Patient presents with  . Arm Pain    Miguel E Whistler Jr. is a 44 y.o. male.  The history is provided by the patient.  Arm Pain This is a new problem. The current episode started more than 1 week ago. The problem occurs daily. The problem has been gradually worsening. Pertinent negatives include no chest pain and no abdominal pain. Exacerbated by: palpation. The symptoms are relieved by rest.  Patient with history of diabetes presents with right arm pain.  He reports he hit the back of his right upper arm about 8 days ago on a nail.  It was not embedded into the skin.  He reports since that time he has had pain and swelling and bruising to the arm.  He reports fevers at home up to 103.  No vomiting.  No chest pain or shortness of breath.  No worsening cough.  He is continue to work and was encouraged to come in to be evaluated. He does not take anticoagulation    Past Medical History:  Diagnosis Date  . Abnormal weight loss    30 pounds since 11/2010  . Anxiety   . Cocaine use   . Depression    no medication  . Diabetes mellitus (HCC)   . Esophageal varices (HCC) 12/22/10    grade 1 egd by Dr. Rourk  . Gastric erosions 12/22/10    egd by Dr. Rourk  . GERD (gastroesophageal reflux disease)   . Hiatal hernia 12/22/10    egd by Dr. Rourk  . Hypertension   . Hypertensive heart disease without CHF   . 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 Rourk  . Myocardial infarct (HCC) 06/2019  . Portal hypertensive gastropathy   . PTSD (post-traumatic stress disorder)   . SVT (supraventricular tachycardia) (HCC)   . Tobacco abuse     Patient Active Problem List   Diagnosis Date Noted  . SVT (supraventricular tachycardia) (HCC) 08/28/2019  . Cocaine use 08/28/2019  . Liver lesion 06/13/2019  . Polysubstance abuse (HCC) 05/24/2019  . Type 2 diabetes  mellitus (HCC) 05/24/2019  . Chest pain 05/23/2019  . Hypertension   . GERD (gastroesophageal reflux disease)   . Anxiety with depression   . Hyperglycemia   . Morbid obesity (HCC)   . Abdominal pain 01/17/2011  . Weight loss, abnormal 01/17/2011  . Vomiting 01/17/2011  . Hematemesis 12/22/2010  . Hypertensive urgency 12/22/2010  . Headache(784.0) 12/22/2010  . Dehydration 12/22/2010  . Hematuria, microscopic 12/22/2010    Past Surgical History:  Procedure Laterality Date  . ESOPHAGOGASTRODUODENOSCOPY  12/22/2010   Procedure: ESOPHAGOGASTRODUODENOSCOPY (EGD);  Surgeon: Robert M Rourk, MD;  [facility];  Service: Endoscopy;  Laterality: N/A;.    ERE, M-W tear, gastric erosions, bx benign, minimal esophageal variecs, portal gastropathy  . FOOT SURGERY    . NASAL RECONSTRUCTION  2003       Family History  Problem Relation Age of Onset  . Hypertension Father   . Diabetes Father   . Hypertension Paternal Grandmother   . Diabetes Maternal Grandmother   . Colon cancer Neg Hx   . Ulcers Neg Hx   . Colon polyps Neg Hx     Social History   Tobacco Use  . Smoking status: Current Every Day Smoker    Packs/day: 0.25      Types: Cigarettes  . Smokeless tobacco: Never Used  Vaping Use  . Vaping Use: Never used  Substance Use Topics  . Alcohol use: Yes    Alcohol/week: 15.0 standard drinks    Types: 15 Shots of liquor per week    Comment: Drinks a fifth a week- pt denied 06/13/19 "none past 3 weeks"  . Drug use: Yes    Types: Marijuana, Cocaine, IV    Comment: last used in 08/27/2019    Home Medications Prior to Admission medications   Medication Sig Start Date End Date Taking? Authorizing Provider  acetaminophen (TYLENOL) 325 MG tablet Take 2 tablets (650 mg total) by mouth every 6 (six) hours as needed for mild pain (or Fever >/= 101). Patient not taking: Reported on 01/17/2020 05/24/19   Johnson, Clanford L, MD  amLODipine (NORVASC) 10 MG tablet Take 10 mg by  mouth daily. 11/18/19   [provider]  aspirin EC 81 MG tablet Take 81 mg by mouth daily.    [provider]  atorvastatin (LIPITOR) 10 MG tablet Take 1 tablet (10 mg total) by mouth daily at 6 PM. 05/24/19   Johnson, Clanford L, MD  blood glucose meter kit and supplies Dispense based on patient and insurance preference. Use up to four times daily as directed. (FOR ICD-10 E10.9, E11.9). 05/24/19   Johnson, Clanford L, MD  lisinopril (ZESTRIL) 10 MG tablet Take 10 mg by mouth daily. 12/16/19   [provider]  metFORMIN (GLUCOPHAGE XR) 500 MG 24 hr tablet Take 1 tablet (500 mg total) by mouth 2 (two) times daily with a meal. 05/24/19 01/17/20  Johnson, Clanford L, MD  metoprolol tartrate (LOPRESSOR) 25 MG tablet Take 25 mg by mouth 2 (two) times daily. 12/22/19   [provider]  pantoprazole (PROTONIX) 40 MG tablet Take 1 tablet (40 mg total) by mouth daily. Patient not taking: Reported on 01/17/2020 05/25/19   Johnson, Clanford L, MD  spironolactone (ALDACTONE) 50 MG tablet Take 50 mg by mouth daily. 11/18/19   [provider]    Allergies    Patient has no known allergies.  Review of Systems   Review of Systems  Constitutional: Positive for fatigue and fever.  Cardiovascular: Negative for chest pain.  Gastrointestinal: Negative for abdominal pain.  Musculoskeletal: Positive for joint swelling.  Skin: Positive for wound.  All other systems reviewed and are negative.   Physical Exam Updated Vital Signs BP (!) 161/93 (BP Location: Left Arm)   Pulse 72   Temp 98 F (36.7 C) (Oral)   Resp 20   Ht 1.753 m (5' 9")   Wt 117.9 kg   SpO2 98%   BMI 38.40 kg/m   Physical Exam CONSTITUTIONAL: Well developed/well nourished HEAD: Normocephalic/atraumatic EYES: EOMI/PERRL ENMT: Mucous membranes moist NECK: supple no meningeal signs SPINE/BACK:entire spine nontender CV: S1/S2 noted, no murmurs/rubs/gallops noted LUNGS: Lungs are clear to  auscultation bilaterally, no apparent distress ABDOMEN: soft, nontender NEURO: Pt is awake/alert/appropriate, moves all extremitiesx4.  No facial droop.   EXTREMITIES: pulses normal/equal, full ROM Puncture wound noted on the right upper arm.  There is bruising noted throughout the right arm.  Distal pulses intact.  Minimal warmth is noted.  No abscess.  He has full flexion-extension of right elbow and wrist.  See photo below No crepitus is noted SKIN: warm, color normal PSYCH: no abnormalities of mood noted, alert and oriented to situation        ED Results / Procedures / Treatments     Labs (all labs ordered are listed, but only abnormal results are displayed) Labs Reviewed  COMPREHENSIVE METABOLIC PANEL - Abnormal; Notable for the following components:      Result Value   Glucose, Bld 141 (*)    BUN 23 (*)    Calcium 8.6 (*)    All other components within normal limits  CBC WITH DIFFERENTIAL/PLATELET  PROTIME-INR  LACTIC ACID, PLASMA    EKG None  Radiology DG Forearm Right  Result Date: 03/05/2020 CLINICAL DATA:  Right forearm pain EXAM: RIGHT FOREARM - 2 VIEW COMPARISON:  None. FINDINGS: There is moderate subcutaneous edema seen diffusely involving the ulnar aspect of the right forearm. Normal alignment. No fracture or dislocation. No destructive osseous lesion. No retained radiopaque foreign body IMPRESSION: Subcutaneous edema. No fracture, dislocation, or retained radiopaque foreign body. Intermittent Electronically Signed   By: Fidela Salisbury MD   On: 03/05/2020 03:38   DG Humerus Right  Result Date: 03/05/2020 CLINICAL DATA:  Right arm pain EXAM: RIGHT HUMERUS - 2+ VIEW COMPARISON:  None. FINDINGS: There is no evidence of fracture or other focal bone lesions. Soft tissues are unremarkable. IMPRESSION: Negative. Electronically Signed   By: Fidela Salisbury MD   On: 03/05/2020 03:38    Procedures Procedures   Medications Ordered in ED Medications  Tdap (BOOSTRIX)  injection 0.5 mL (0.5 mLs Intramuscular Given 03/05/20 0515)  HYDROcodone-acetaminophen (NORCO/VICODIN) 5-325 MG per tablet 1 tablet (1 tablet Oral Given 03/05/20 0515)  doxycycline (VIBRA-TABS) tablet 100 mg (100 mg Oral Given 03/05/20 0515)    ED Course  I have reviewed the triage vital signs and the nursing notes.  Pertinent labs & imaging results that were available during my care of the patient were reviewed by me and considered in my medical decision making (see chart for details).    MDM Rules/Calculators/A&P                          Patient presented up to 8 days after injuring his right arm.  He reports he fell into a nail puncturing part of his arm.  No acute fracture is noted.  There is no crepitus on exam.  Patient reported fevers at home but there is none here.  Labs overall reassuring.  Suspect this is all extensive bruising from the initial trauma 8 days ago.  Lower suspicion for deep space infection or necrotizing fasciitis.  Will place on antibiotics in case there is any overlying cellulitis, but strong suspicion for bruising. Patient agreeable with plan.  We discussed strict return precautions Final Clinical Impression(s) / ED Diagnoses Final diagnoses:  Right arm pain  Bruising    Rx / DC Orders ED Discharge Orders         Ordered    doxycycline (VIBRAMYCIN) 100 MG capsule  2 times daily        03/05/20 0501    HYDROcodone-acetaminophen (NORCO/VICODIN) 5-325 MG tablet  Every 6 hours PRN        03/05/20 0559           Ripley Fraise, MD 03/05/20 [phone]

## 2020-03-05 NOTE — ED Notes (Signed)
Patient educated about not driving or performing other critical tasks (such as operating heavy machinery, caring for infant/toddler/child) due to sedative nature of narcotic medications received while in the ED.  Pt/caregiver verbalized understanding.   

## 2020-03-05 NOTE — ED Notes (Signed)
ED Provider at bedside. 

## 2020-07-08 ENCOUNTER — Other Ambulatory Visit: Payer: Self-pay

## 2020-07-08 ENCOUNTER — Emergency Department (HOSPITAL_COMMUNITY)
Admission: EM | Admit: 2020-07-08 | Discharge: 2020-07-08 | Disposition: A | Discharge status: Home / Self Care | Payer: BC Managed Care – PPO | Attending: Emergency Medicine | Admitting: Emergency Medicine

## 2020-07-08 ENCOUNTER — Emergency Department (HOSPITAL_COMMUNITY): Payer: BC Managed Care – PPO

## 2020-07-08 ENCOUNTER — Encounter (HOSPITAL_COMMUNITY): Payer: Self-pay | Admitting: *Deleted

## 2020-07-08 DIAGNOSIS — R062 Wheezing: Secondary | ICD-10-CM | POA: Diagnosis not present

## 2020-07-08 DIAGNOSIS — I509 Heart failure, unspecified: Secondary | ICD-10-CM | POA: Insufficient documentation

## 2020-07-08 DIAGNOSIS — R0602 Shortness of breath: Secondary | ICD-10-CM | POA: Diagnosis not present

## 2020-07-08 DIAGNOSIS — Z20822 Contact with and (suspected) exposure to covid-19: Secondary | ICD-10-CM | POA: Insufficient documentation

## 2020-07-08 DIAGNOSIS — J441 Chronic obstructive pulmonary disease with (acute) exacerbation: Secondary | ICD-10-CM

## 2020-07-08 DIAGNOSIS — Z79899 Other long term (current) drug therapy: Secondary | ICD-10-CM | POA: Diagnosis not present

## 2020-07-08 DIAGNOSIS — Z7984 Long term (current) use of oral hypoglycemic drugs: Secondary | ICD-10-CM | POA: Insufficient documentation

## 2020-07-08 DIAGNOSIS — F17210 Nicotine dependence, cigarettes, uncomplicated: Secondary | ICD-10-CM | POA: Diagnosis not present

## 2020-07-08 DIAGNOSIS — E119 Type 2 diabetes mellitus without complications: Secondary | ICD-10-CM | POA: Diagnosis not present

## 2020-07-08 DIAGNOSIS — I110 Hypertensive heart disease with heart failure: Secondary | ICD-10-CM | POA: Insufficient documentation

## 2020-07-08 HISTORY — DX: Heart failure, unspecified: I50.9

## 2020-07-08 LAB — COMPREHENSIVE METABOLIC PANEL
ALT: 25 U/L (ref 0–44)
AST: 22 U/L (ref 15–41)
Albumin: 3.8 g/dL (ref 3.5–5.0)
Alkaline Phosphatase: 51 U/L (ref 38–126)
Anion gap: 9 (ref 5–15)
BUN: 14 mg/dL (ref 6–20)
CO2: 26 mmol/L (ref 22–32)
Calcium: 9.1 mg/dL (ref 8.9–10.3)
Chloride: 107 mmol/L (ref 98–111)
Creatinine, Ser: 1.27 mg/dL — ABNORMAL HIGH (ref 0.61–1.24)
GFR, Estimated: >60 mL/min (ref >60)
Glucose, Bld: 109 mg/dL — ABNORMAL HIGH (ref 70–99)
Potassium: 3.7 mmol/L (ref 3.5–5.1)
Sodium: 142 mmol/L (ref 135–145)
Total Bilirubin: 0.8 mg/dL (ref 0.3–1.2)
Total Protein: 7.1 g/dL (ref 6.5–8.1)

## 2020-07-08 LAB — CBC WITH DIFFERENTIAL/PLATELET
Abs Immature Granulocytes: 0.02 10*3/uL (ref 0.00–0.07)
Basophils Absolute: 0 10*3/uL (ref 0.0–0.1)
Basophils Relative: 0 %
Eosinophils Absolute: 0 10*3/uL (ref 0.0–0.5)
Eosinophils Relative: 1 %
HCT: 47.6 % (ref 39.0–52.0)
Hemoglobin: 15.5 g/dL (ref 13.0–17.0)
Immature Granulocytes: 0 %
Lymphocytes Relative: 28 %
Lymphs Abs: 1.5 10*3/uL (ref 0.7–4.0)
MCH: 30.4 pg (ref 26.0–34.0)
MCHC: 32.6 g/dL (ref 30.0–36.0)
MCV: 93.3 fL (ref 80.0–100.0)
Monocytes Absolute: 0.8 10*3/uL (ref 0.1–1.0)
Monocytes Relative: 16 %
Neutro Abs: 2.9 10*3/uL (ref 1.7–7.7)
Neutrophils Relative %: 55 %
Platelets: 252 10*3/uL (ref 150–400)
RBC: 5.1 MIL/uL (ref 4.22–5.81)
RDW: 14.5 % (ref 11.5–15.5)
WBC: 5.3 10*3/uL (ref 4.0–10.5)
nRBC: 0 % (ref 0.0–0.2)

## 2020-07-08 LAB — TROPONIN I (HIGH SENSITIVITY)
Troponin I (High Sensitivity): 135 ng/L (ref <18)
Troponin I (High Sensitivity): 104 ng/L (ref <18)

## 2020-07-08 LAB — BRAIN NATRIURETIC PEPTIDE: B Natriuretic Peptide: 34 pg/mL (ref 0.0–100.0)

## 2020-07-08 LAB — RESP PANEL BY RT-PCR (FLU A&B, COVID) ARPGX2
Influenza A by PCR: NEGATIVE
Influenza B by PCR: NEGATIVE
SARS Coronavirus 2 by RT PCR: NEGATIVE

## 2020-07-08 LAB — D-DIMER, QUANTITATIVE: D-Dimer, Quant: <0.27 ug/mL-FEU (ref 0.00–0.50)

## 2020-07-08 MED ORDER — LISINOPRIL 10 MG PO TABS
10.0000 mg | ORAL_TABLET | Freq: Once | ORAL | Status: AC
  Administered 2020-07-08: 10 mg via ORAL
  Filled 2020-07-08: qty 1

## 2020-07-08 MED ORDER — ALBUTEROL SULFATE HFA 108 (90 BASE) MCG/ACT IN AERS
2.0000 | INHALATION_SPRAY | Freq: Once | RESPIRATORY_TRACT | Status: AC
  Administered 2020-07-08: 2 via RESPIRATORY_TRACT
  Filled 2020-07-08: qty 6.7

## 2020-07-08 MED ORDER — HYDRALAZINE HCL 20 MG/ML IJ SOLN
5.0000 mg | Freq: Once | INTRAMUSCULAR | Status: AC
  Administered 2020-07-08: 5 mg via INTRAVENOUS
  Filled 2020-07-08: qty 1

## 2020-07-08 MED ORDER — METOPROLOL TARTRATE 25 MG PO TABS
25.0000 mg | ORAL_TABLET | Freq: Once | ORAL | Status: AC
  Administered 2020-07-08: 25 mg via ORAL
  Filled 2020-07-08: qty 1

## 2020-07-08 MED ORDER — AMLODIPINE BESYLATE 5 MG PO TABS
10.0000 mg | ORAL_TABLET | Freq: Once | ORAL | Status: AC
  Administered 2020-07-08: 10 mg via ORAL
  Filled 2020-07-08: qty 2

## 2020-07-08 MED ORDER — CLONAZEPAM 0.5 MG PO TABS: 0.5000 mg | ORAL_TABLET | Freq: Two times a day (BID) | ORAL | 0 refills | Status: AC | PRN

## 2020-07-08 NOTE — ED Triage Notes (Addendum)
Pt c/o SOB since he started a new job 3 weeks ago. He reports it feels like his "lungs are tight" and can't breathe normally. Pt reports he had Covid back in January and has "never been the same again".

## 2020-07-08 NOTE — ED Provider Notes (Signed)
[facility] EMERGENCY DEPARTMENT Provider Note   CSN: [id number] Arrival date & time: 07/08/20  1024     History Chief Complaint  Patient presents with  . Shortness of Breath    Miguel Peter. is a 45 y.o. male.  Patient complains of shortness of breath.  Patient states he has a new job where he is inhaling particles and he feels like this is affecting his breathing.  Patient complains of wheezing  The history is provided by the patient and medical records. No language interpreter was used.  Shortness of Breath Severity:  Moderate Onset quality:  Sudden Timing:  Constant Progression:  Worsening Chronicity:  New Context: activity   Relieved by:  Nothing Worsened by:  Nothing Ineffective treatments:  None tried Associated symptoms: no abdominal pain, no chest pain, no cough, no headaches and no rash        Past Medical History:  Diagnosis Date  . Abnormal weight loss    30 pounds since 11/2010  . Anxiety   . CHF (congestive heart failure) ([place])   . Cocaine use   . Depression    no medication  . Diabetes mellitus ([place])   . Esophageal varices (HCC) 12/22/10    grade 1 egd by Dr. Gala Romney  . Gastric erosions 12/22/10    egd by Dr. Gala Romney  . GERD (gastroesophageal reflux disease)   . Hiatal hernia 12/22/10    egd by Dr. Gala Romney  . Hypertension   . Hypertensive heart disease without CHF   . 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 Gala Romney  . Myocardial infarct ([place]) 06/2019  . Portal hypertensive gastropathy   . PTSD (post-traumatic stress disorder)   . SVT (supraventricular tachycardia) ([place])   . Tobacco abuse     Patient Active Problem List   Diagnosis Date Noted  . SVT (supraventricular tachycardia) ([place]) 08/28/2019  . Cocaine use 08/28/2019  . Liver lesion 06/13/2019  . Polysubstance abuse ([place]) 05/24/2019  . Type 2 diabetes mellitus ([place]) 05/24/2019  . Chest pain 05/23/2019  . Hypertension   . GERD (gastroesophageal reflux disease)   . Anxiety with  depression   . Hyperglycemia   . Morbid obesity ([place])   . Abdominal pain 01/17/2011  . Weight loss, abnormal 01/17/2011  . Vomiting 01/17/2011  . Hematemesis 12/22/2010  . Hypertensive urgency 12/22/2010  . Headache(784.0) 12/22/2010  . Dehydration 12/22/2010  . Hematuria, microscopic 12/22/2010    Past Surgical History:  Procedure Laterality Date  . ESOPHAGOGASTRODUODENOSCOPY  12/22/2010   Procedure: ESOPHAGOGASTRODUODENOSCOPY (EGD);  Surgeon: Daneil Dolin, MD;  Location: [facility];  Service: Endoscopy;  Laterality: N/A;.    ERE, M-W tear, gastric erosions, bx benign, minimal esophageal variecs, portal gastropathy  . FOOT SURGERY    . NASAL RECONSTRUCTION  2003       Family History  Problem Relation Age of Onset  . Hypertension Father   . Diabetes Father   . Hypertension Paternal Grandmother   . Diabetes Maternal Grandmother   . Colon cancer Neg Hx   . Ulcers Neg Hx   . Colon polyps Neg Hx     Social History   Tobacco Use  . Smoking status: Current Some Day Smoker    Packs/day: 0.25    Types: Cigarettes  . Smokeless tobacco: Never Used  Vaping Use  . Vaping Use: Never used  Substance Use Topics  . Alcohol use: Not Currently    Alcohol/week: 15.0 standard drinks  Types: 15 Shots of liquor per week    Comment: Drinks a fifth a week- pt denied 06/13/19 "none past 3 weeks"  . Drug use: Yes    Types: Marijuana, Cocaine, IV    Comment: "powder"     Home Medications Prior to Admission medications   Medication Sig Start Date End Date Taking? Authorizing Provider  amLODipine (NORVASC) 10 MG tablet Take 10 mg by mouth daily. 11/18/19  Yes [provider]  lisinopril (ZESTRIL) 10 MG tablet Take 10 mg by mouth daily. 12/16/19  Yes [provider]  metFORMIN (GLUCOPHAGE XR) 500 MG 24 hr tablet Take 1 tablet (500 mg total) by mouth 2 (two) times daily with a meal. 05/24/19 01/17/20 Yes Johnson, Clanford L, MD  metoprolol tartrate (LOPRESSOR) 25 MG  tablet Take 25 mg by mouth 2 (two) times daily. 12/22/19  Yes [provider]  pantoprazole (PROTONIX) 40 MG tablet Take 1 tablet (40 mg total) by mouth daily. 05/25/19  Yes Johnson, Clanford L, MD  spironolactone (ALDACTONE) 50 MG tablet Take 50 mg by mouth daily. 11/18/19  Yes [provider]  atorvastatin (LIPITOR) 10 MG tablet Take 1 tablet (10 mg total) by mouth daily at 6 PM. 05/24/19   Wynetta Emery, Clanford L, MD  blood glucose meter kit and supplies Dispense based on patient and insurance preference. Use up to four times daily as directed. (FOR ICD-10 E10.9, E11.9). 05/24/19   Johnson, Clanford L, MD  doxycycline (VIBRAMYCIN) 100 MG capsule Take 1 capsule (100 mg total) by mouth 2 (two) times daily. One po bid x 7 days Patient not taking: Reported on 07/08/2020 03/05/20   Ripley Fraise, MD  HYDROcodone-acetaminophen (NORCO/VICODIN) 5-325 MG tablet Take 1 tablet by mouth every 6 (six) hours as needed for severe pain. Patient not taking: Reported on 07/08/2020 03/05/20   Ripley Fraise, MD    Allergies    Patient has no known allergies.  Review of Systems   Review of Systems  Constitutional: Negative for appetite change and fatigue.  HENT: Negative for congestion, ear discharge and sinus pressure.   Eyes: Negative for discharge.  Respiratory: Positive for shortness of breath. Negative for cough.   Cardiovascular: Negative for chest pain.  Gastrointestinal: Negative for abdominal pain and diarrhea.  Genitourinary: Negative for frequency and hematuria.  Musculoskeletal: Negative for back pain.  Skin: Negative for rash.  Neurological: Negative for seizures and headaches.  Psychiatric/Behavioral: Negative for hallucinations.    Physical Exam Updated Vital Signs BP (!) 189/110 (BP Location: Left Arm)   Pulse 77   Temp 98.1 F (36.7 C) (Oral)   Resp 20   Ht 5' 9" (1.753 m)   Wt 108.9 kg   SpO2 98%   BMI 35.44 kg/m   Physical Exam Vitals and nursing note reviewed.   Constitutional:      Appearance: He is well-developed.  HENT:     Head: Normocephalic.     Mouth/Throat:     Mouth: Mucous membranes are moist.  Eyes:     General: No scleral icterus.    Conjunctiva/sclera: Conjunctivae normal.  Neck:     Thyroid: No thyromegaly.  Cardiovascular:     Rate and Rhythm: Normal rate and regular rhythm.     Heart sounds: No murmur heard. No friction rub. No gallop.   Pulmonary:     Breath sounds: No stridor. Wheezing present. No rales.  Chest:     Chest wall: No tenderness.  Abdominal:     General: There is no  distension.     Tenderness: There is no abdominal tenderness. There is no rebound.  Musculoskeletal:        General: Normal range of motion.     Cervical back: Neck supple.  Lymphadenopathy:     Cervical: No cervical adenopathy.  Skin:    Findings: No erythema or rash.  Neurological:     Mental Status: He is alert and oriented to person, place, and time.     Motor: No abnormal muscle tone.     Coordination: Coordination normal.  Psychiatric:        Behavior: Behavior normal.     ED Results / Procedures / Treatments   Labs (all labs ordered are listed, but only abnormal results are displayed) Labs Reviewed  COMPREHENSIVE METABOLIC PANEL - Abnormal; Notable for the following components:      Result Value   Glucose, Bld 109 (*)    Creatinine, Ser 1.27 (*)    All other components within normal limits  TROPONIN I (HIGH SENSITIVITY) - Abnormal; Notable for the following components:   Troponin I (High Sensitivity) 135 (*)    All other components within normal limits  RESP PANEL BY RT-PCR (FLU A&B, COVID) ARPGX2  CBC WITH DIFFERENTIAL/PLATELET  BRAIN NATRIURETIC PEPTIDE  D-DIMER, QUANTITATIVE  TROPONIN I (HIGH SENSITIVITY)    EKG None  Radiology DG Chest Port 1 View  Result Date: 07/08/2020 CLINICAL DATA:  Shortness of breath, chest pain. EXAM: PORTABLE CHEST 1 VIEW COMPARISON:  Aug 28, 2019. FINDINGS: The heart size and  mediastinal contours are within normal limits. Both lungs are clear. No pneumothorax or pleural effusion is noted. The visualized skeletal structures are unremarkable. IMPRESSION: No active disease. Electronically Signed   By: Marijo Conception M.D.   On: 07/08/2020 11:15    Procedures Procedures   Medications Ordered in ED Medications  hydrALAZINE (APRESOLINE) injection 5 mg (5 mg Intravenous Given 07/08/20 1058)    ED Course  I have reviewed the triage vital signs and the nursing notes.  Pertinent labs & imaging results that were available during my care of the patient were reviewed by me and considered in my medical decision making (see chart for details).    CRITICAL CARE Performed by: Milton Ferguson Total critical care time:40 minutes Critical care time was exclusive of separately billable procedures and treating other patients. Critical care was necessary to treat or prevent imminent or life-threatening deterioration. Critical care was time spent personally by me on the following activities: development of treatment plan with patient and/or surrogate as well as nursing, discussions with consultants, evaluation of patient's response to treatment, examination of patient, obtaining history from patient or surrogate, ordering and performing treatments and interventions, ordering and review of laboratory studies, ordering and review of radiographic studies, pulse oximetry and re-evaluation of patient's condition.  MDM Rules/Calculators/A&P                          Patient with shortness of breath and wheezing..  Suspect asthma COPD exacerbation.  Patient improved with albuterol.  His troponins are slightly elevated but he has a history of heart failure and they are descending without chest pain.  I suspect noncoronary artery cause for elevated troponin.  Patient is given albuterol inhaler to use and is to follow-up with his cardiologist and primary care doctor.  Patient also has poorly  controlled blood pressure.  He has not taken his medicine today Final Clinical Impression(s) / ED Diagnoses Final  diagnoses:  None    Rx / DC Orders ED Discharge Orders    None       Milton Ferguson, MD 07/09/20 1346

## 2020-07-08 NOTE — Discharge Instructions (Signed)
Use the inhaler every 6 hours as needed for shortness of breath.  Follow-up with your family doctor in a week or 2 and follow-up with Dr. Harl Bowie or one of his partners in a cardiology group here in 1 to 2 weeks

## 2020-08-04 ENCOUNTER — Encounter: Payer: Self-pay | Admitting: Student

## 2020-08-04 ENCOUNTER — Ambulatory Visit (INDEPENDENT_AMBULATORY_CARE_PROVIDER_SITE_OTHER): Payer: BC Managed Care – PPO | Admitting: Student

## 2020-08-04 ENCOUNTER — Other Ambulatory Visit: Payer: Self-pay

## 2020-08-04 VITALS — BP 180/110 | HR 90 | Ht 69.0 in | Wt 265.0 lb

## 2020-08-04 DIAGNOSIS — R778 Other specified abnormalities of plasma proteins: Secondary | ICD-10-CM

## 2020-08-04 DIAGNOSIS — R0600 Dyspnea, unspecified: Secondary | ICD-10-CM | POA: Diagnosis not present

## 2020-08-04 DIAGNOSIS — I10 Essential (primary) hypertension: Secondary | ICD-10-CM

## 2020-08-04 DIAGNOSIS — I471 Supraventricular tachycardia: Secondary | ICD-10-CM | POA: Diagnosis not present

## 2020-08-04 DIAGNOSIS — Z79899 Other long term (current) drug therapy: Secondary | ICD-10-CM

## 2020-08-04 DIAGNOSIS — R0609 Other forms of dyspnea: Secondary | ICD-10-CM

## 2020-08-04 MED ORDER — LISINOPRIL 20 MG PO TABS: 20.0000 mg | ORAL_TABLET | Freq: Every day | ORAL | 3 refills | Status: DC

## 2020-08-04 NOTE — Patient Instructions (Signed)
Medication Instructions:  Your physician has recommended you make the following change in your medication:   Increase Lisinopril to 20 mg Daily   Track Blood Pressure for 2-3 weeks and bring by office.   *If you need a refill on your cardiac medications before your next appointment, please call your pharmacy*   Lab Work: Your physician recommends that you return for lab work in: 2 Weeks ( 08-19-20)  If you have labs (blood work) drawn today and your tests are completely normal, you will receive your results only by: Marland Kitchen MyChart Message (if you have MyChart) OR . A paper copy in the mail If you have any lab test that is abnormal or we need to change your treatment, we will call you to review the results.   Testing/Procedures: Your physician has recommended that you have a pulmonary function test. Pulmonary Function Tests are a group of tests that measure how well air moves in and out of your lungs.  Follow-Up: At [facility], you and your health needs are our priority.  As part of our continuing mission to provide you with exceptional heart care, we have created designated Provider Care Teams.  These Care Teams include your primary Cardiologist (physician) and Advanced Practice Providers (APPs -  Physician Assistants and Nurse Practitioners) who all work together to provide you with the care you need, when you need it.  We recommend signing up for the patient portal called "MyChart".  Sign up information is provided on this After Visit Summary.  MyChart is used to connect with patients for Virtual Visits (Telemedicine).  Patients are able to view lab/test results, encounter notes, upcoming appointments, etc.  Non-urgent messages can be sent to your provider as well.   To learn more about what you can do with MyChart, go to [email or web address].    Your next appointment:   6-8 week(s)  The format for your next appointment:   In Person  Provider:   Bernerd Pho, PA-C or Ermalinda Barrios, PA-C   Other Instructions Thank you for choosing [facility]!

## 2020-08-04 NOTE — Progress Notes (Signed)
Cardiology Office Note    Date:  08/04/2020   ID:  Miguel Longs., DOB 08/22/1975, MRN [id number]  PCP:  Abran Richard, MD  Cardiologist: Kate Sable, MD (Inactive)  --> Evaluated by Dr. Domenic Polite during prior admission but no outpatient follow-up  Chief Complaint  Patient presents with  . Follow-up    Overdue visit    History of Present Illness:    Miguel Elliott. is a 45 y.o. male with past medical history of SVT, HTN, Type 2 DM, GERD, esophageal varices, Mallory-Weiss tear, PTSD and substance use (cocaine and tobacco use) who presents to the office today for overdue follow-up.  He was last examined by the Cardiology service during an admission in 08/2019 for evaluation of chest pain which had started after using cocaine. He was in SVT upon arrival to the ED and was shocked at 100 J. Recent echocardiogram had shown a mildly reduced EF of 50 to 55% and it was recommended he follow-up as an outpatient for consideration of a Lexiscan Myoview. He was not started on a beta-blocker given that symptoms had occurred after using cocaine  Most recently presented back to the ED on 07/08/2020 for evaluation of dyspnea and he reported working on a new job and felt like he was inhaling flour particles at his work. It was felt his symptoms were secondary to asthma or COPD as they improved with Albuterol.  BNP was normal at 34 and D-dimer was negative. Hs Troponin values were elevated at 135 and 104 but were felt to be secondary to his dyspnea and elevated BP. Was recommended to follow-up with Cardiology as an outpatient.   In talking with the patient today, he reports his breathing has significantly improved since his ED visit. Still using Albuterol as needed. Says he has not smoked cigarettes or used cocaine since he started having respiratory issues. He denies any exertional chest pain, orthopnea, PND or pitting edema.   BP is elevated at 180/110 today and he reports compliance with his  medications. Says he checks his BP at home as well and it is typically elevated.   Past Medical History:  Diagnosis Date  . Abnormal weight loss    30 pounds since 11/2010  . Anxiety   . CHF (congestive heart failure) ([place])   . Cocaine use   . COPD (chronic obstructive pulmonary disease) ([place])   . Depression    no medication  . Diabetes mellitus ([place])   . Esophageal varices (H[facility]) 12/22/10    grade 1 egd by Dr. Gala Romney  . Gastric erosions 12/22/10    egd by Dr. Gala Romney  . GERD (gastroesophageal reflux disease)   . Hiatal hernia 12/22/10    egd by Dr. Gala Romney  . Hypertension   . Hypertensive heart disease without CHF   . 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 Gala Romney  . Myocardial infarct ([place]) 06/2019  . Portal hypertensive gastropathy   . PTSD (post-traumatic stress disorder)   . SVT (supraventricular tachycardia) ([place])   . Tobacco abuse     Past Surgical History:  Procedure Laterality Date  . ESOPHAGOGASTRODUODENOSCOPY  12/22/2010   Procedure: ESOPHAGOGASTRODUODENOSCOPY (EGD);  Surgeon: Daneil Dolin, MD;  Location: [facility];  Service: Endoscopy;  Laterality: N/A;.    ERE, M-W tear, gastric erosions, bx benign, minimal esophageal variecs, portal gastropathy  . FOOT SURGERY    . NASAL RECONSTRUCTION  2003    Current Medications: Outpatient Medications Prior to Visit  Medication Sig Dispense Refill  . amLODipine (NORVASC) 10 MG tablet Take 10 mg by mouth daily.    Marland Kitchen atorvastatin (LIPITOR) 10 MG tablet Take 1 tablet (10 mg total) by mouth daily at 6 PM. 30 tablet 0  . blood glucose meter kit and supplies Dispense based on patient and insurance preference. Use up to four times daily as directed. (FOR ICD-10 E10.9, E11.9). 1 each 0  . clonazePAM (KLONOPIN) 0.5 MG tablet Take 1 tablet (0.5 mg total) by mouth 2 (two) times daily as needed for anxiety. 20 tablet 0  . metFORMIN (GLUCOPHAGE XR) 500 MG 24 hr tablet Take 1 tablet (500 mg total) by mouth 2 (two) times daily with a meal.  60 tablet 1  . metoprolol tartrate (LOPRESSOR) 25 MG tablet Take 25 mg by mouth 2 (two) times daily.    . pantoprazole (PROTONIX) 40 MG tablet Take 1 tablet (40 mg total) by mouth daily. 30 tablet 1  . spironolactone (ALDACTONE) 50 MG tablet Take 50 mg by mouth daily.    . furosemide (LASIX) 20 MG tablet Take 20 mg by mouth daily.    Marland Kitchen lisinopril (ZESTRIL) 10 MG tablet Take 10 mg by mouth daily.    Marland Kitchen doxycycline (VIBRAMYCIN) 100 MG capsule Take 1 capsule (100 mg total) by mouth 2 (two) times daily. One po bid x 7 days (Patient not taking: Reported on 07/08/2020) 14 capsule 0  . HYDROcodone-acetaminophen (NORCO/VICODIN) 5-325 MG tablet Take 1 tablet by mouth every 6 (six) hours as needed for severe pain. (Patient not taking: Reported on 07/08/2020) 5 tablet 0   No facility-administered medications prior to visit.     Allergies:   Patient has no known allergies.   Social History   Socioeconomic History  . Marital status: Single    Spouse name: Not on file  . Number of children: 2  . Years of education: Not on file  . Highest education level: Not on file  Occupational History  . Occupation: unemployed  Tobacco Use  . Smoking status: Current Some Day Smoker    Packs/day: 0.25    Types: Cigarettes  . Smokeless tobacco: Never Used  Vaping Use  . Vaping Use: Never used  Substance and Sexual Activity  . Alcohol use: Not Currently    Alcohol/week: 15.0 standard drinks    Types: 15 Shots of liquor per week    Comment: Drinks a fifth a week- pt denied 06/13/19 "none past 3 weeks"  . Drug use: Yes    Types: Marijuana, Cocaine, IV    Comment: "powder"   . Sexual activity: Yes  Other Topics Concern  . Not on file  Social History Narrative  . Not on file   Social Determinants of Health   Financial Resource Strain: Not on file  Food Insecurity: Not on file  Transportation Needs: Not on file  Physical Activity: Not on file  Stress: Not on file  Social Connections: Not on file      Family History:  The patient's family history includes Diabetes in his father and maternal grandmother; Hypertension in his father and paternal grandmother.   Review of Systems:    Please see the history of present illness.     All other systems reviewed and are otherwise negative except as noted above.   Physical Exam:    VS:  BP (!) 180/110   Pulse 90   Ht _0  (1.753 m)   Wt 265 lb (120.2 kg)   SpO2 97%  BMI 39.13 kg/m    General: Well developed, well nourished,male appearing in no acute distress. Head: Normocephalic, atraumatic. Neck: No carotid bruits. JVD not elevated.  Lungs: Respirations regular and unlabored, without wheezes or rales.  Heart: Regular rate and rhythm. No S3 or S4.  No murmur, no rubs, or gallops appreciated. Abdomen: Appears non-distended. No obvious abdominal masses. Msk:  Strength and tone appear normal for age. No obvious joint deformities or effusions. Extremities: No clubbing or cyanosis. No lower extremity edema.  Distal pedal pulses are 2+ bilaterally. Neuro: Alert and oriented X 3. Moves all extremities spontaneously. No focal deficits noted. Psych:  Responds to questions appropriately with a normal affect. Skin: No rashes or lesions noted  Wt Readings from Last 3 Encounters:  08/04/20 265 lb (120.2 kg)  07/08/20 240 lb (108.9 kg)  03/05/20 260 lb (117.9 kg)     Studies/Labs Reviewed:   EKG:  EKG is not ordered today. EKG from 07/08/2020 is reviewed and demonstrates NSR, HR 92 with TWI along the inferior leads and overall similar to prior tracings.   Recent Labs: 08/28/2019: TSH 0.460 07/08/2020: ALT 25; B Natriuretic Peptide 34.0; BUN 14; Creatinine, Ser 1.27; Hemoglobin 15.5; Platelets 252; Potassium 3.7; Sodium 142   Lipid Panel    Component Value Date/Time   CHOL 143 05/24/2019 0617   TRIG 119 05/24/2019 0617   HDL 39 (L) 05/24/2019 0617   CHOLHDL 3.7 05/24/2019 0617   VLDL 24 05/24/2019 0617   LDLCALC 80 05/24/2019 0617     Additional studies/ records that were reviewed today include:   Echocardiogram: 05/24/2019 IMPRESSIONS    1. Left ventricular ejection fraction, by estimation, is 50 to 55%. The  left ventricle has low normal function. The left ventricle has no regional  wall motion abnormalities. There is moderate left ventricular hypertrophy.  Left ventricular diastolic  parameters are consistent with Grade I diastolic dysfunction (impaired  relaxation).  2. Right ventricular systolic function is normal. The right ventricular  size is normal. There is normal pulmonary artery systolic pressure. The  estimated right ventricular systolic pressure is 62.2 mmHg.  3. The mitral valve is grossly normal. Trivial mitral valve  regurgitation.  4. The aortic valve is tricuspid. Aortic valve regurgitation is not  visualized.  5. The inferior vena cava is normal in size with greater than 50%  respiratory variability, suggesting right atrial pressure of 3 mmHg.   Assessment:    1. Dyspnea on exertion   2. Elevated troponin   3. SVT (supraventricular tachycardia) ([place])   4. Essential hypertension   5. Medication management      Plan:   In order of problems listed above:  1. Dyspnea on Exertion - Symptoms recently led to ED evaluation as outlined above and it was felt symptoms were secondary to asthma or COPD as his respiratory status improved with the use of Albuterol. CXR at that time showed no acute findings. Reviewed options with the patient today and will plan for PFT's then referral to Pulmonology if abnormal.   2. Elevated Troponin Values - His enzymes have been mildly elevated during prior ED evaluations and admissions but this has been complicated by intermittent Cocaine use. He does have inferior TWI noted on EKG tracings but denies any recent chest pain.  - Will plan for PFT's as outlined above. Once respiratory status has stabilized, would pursue a [employer] as previously  recommended.   3. SVT - Occurred during his prior admission in the setting of recent  Cocaine use. No reported palpitations recently. Currently on Lopressor 66m BID and pending BP trend, would consider switching to Coreg for improved BP effect and in the setting of prior cocaine use.   4. Accelerated HTN - BP is at 180/110 today and he reports compliance with his medications. Currently on Amlodipine 19mdaily, Lisinopril 1075maily, Lopressor 6m83mD and Spironolactone 50mg50mly. Will titrate Lisinopril to 20mg 46my. Recheck BMET in 2 weeks. He was provided with a BP log and will return this in 2-3 weeks. If remains above goal, would further titrate Lisinopril or switch Lopressor to Coreg.     Medication Adjustments/Labs and Tests Ordered: Current medicines are reviewed at length with the patient today.  Concerns regarding medicines are outlined above.  Medication changes, Labs and Tests ordered today are listed in the Patient Instructions below. Patient Instructions  Medication Instructions:  Your physician has recommended you make the following change in your medication:   Increase Lisinopril to 20 mg Daily   Track Blood Pressure for 2-3 weeks and bring by office.   *If you need a refill on your cardiac medications before your next appointment, please call your pharmacy*   Lab Work: Your physician recommends that you return for lab work in: 2 Weeks ( 08-19-20)  If you have labs (blood work) drawn today and your tests are completely normal, you will receive your results only by: . MyChMarland Kitchenrt Message (if you have MyChart) OR . A paper copy in the mail If you have any lab test that is abnormal or we need to change your treatment, we will call you to review the results.   Testing/Procedures: Your physician has recommended that you have a pulmonary function test. Pulmonary Function Tests are a group of tests that measure how well air moves in and out of your lungs.  Follow-Up: [facility].  As part of our continuing mission to provide you with exceptional heart care, we have created designated Provider Care Teams.  These Care Teams include your primary Cardiologist (physician) and Advanced Practice Providers (APPs -  Physician Assistants and Nurse Practitioners) who all work together to provide you with the care you need, when you need it.  We recommend signing up for the patient portal called "MyChart".  Sign up information is provided on this After Visit Summary.  MyChart is used to connect with patients for Virtual Visits (Telemedicine).  Patients are able to view lab/test results, encounter notes, upcoming appointments, etc.  Non-urgent messages can be sent to your provider as well.   To learn more about what you can do with MyChart, go to [email or web address] next appointment:   6-8 week(s)  The format for your next appointment:   In Person  Provider:   BrittaBernerd Pho or MichelErmalinda Barrios   Other Instructions [facility] you [facility]  Signed, BrittaErma Heritage  08/04/2020 7:24 PM    [facility] [id number] [id number]: [phone]

## 2020-08-24 ENCOUNTER — Emergency Department (HOSPITAL_COMMUNITY)
Admission: EM | Admit: 2020-08-24 | Discharge: 2020-08-24 | Disposition: A | Discharge status: Home / Self Care | Payer: BC Managed Care – PPO | Attending: Emergency Medicine | Admitting: Emergency Medicine

## 2020-08-24 ENCOUNTER — Other Ambulatory Visit: Payer: Self-pay

## 2020-08-24 ENCOUNTER — Encounter (HOSPITAL_COMMUNITY): Payer: Self-pay | Admitting: *Deleted

## 2020-08-24 ENCOUNTER — Emergency Department (HOSPITAL_COMMUNITY): Payer: BC Managed Care – PPO

## 2020-08-24 DIAGNOSIS — I509 Heart failure, unspecified: Secondary | ICD-10-CM | POA: Insufficient documentation

## 2020-08-24 DIAGNOSIS — Z79899 Other long term (current) drug therapy: Secondary | ICD-10-CM | POA: Insufficient documentation

## 2020-08-24 DIAGNOSIS — F17210 Nicotine dependence, cigarettes, uncomplicated: Secondary | ICD-10-CM | POA: Diagnosis not present

## 2020-08-24 DIAGNOSIS — R1013 Epigastric pain: Secondary | ICD-10-CM | POA: Insufficient documentation

## 2020-08-24 DIAGNOSIS — Z7984 Long term (current) use of oral hypoglycemic drugs: Secondary | ICD-10-CM | POA: Diagnosis not present

## 2020-08-24 DIAGNOSIS — E119 Type 2 diabetes mellitus without complications: Secondary | ICD-10-CM | POA: Diagnosis not present

## 2020-08-24 DIAGNOSIS — F1490 Cocaine use, unspecified, uncomplicated: Secondary | ICD-10-CM

## 2020-08-24 DIAGNOSIS — R1310 Dysphagia, unspecified: Secondary | ICD-10-CM | POA: Diagnosis present

## 2020-08-24 DIAGNOSIS — R202 Paresthesia of skin: Secondary | ICD-10-CM | POA: Diagnosis not present

## 2020-08-24 DIAGNOSIS — I110 Hypertensive heart disease with heart failure: Secondary | ICD-10-CM | POA: Diagnosis not present

## 2020-08-24 DIAGNOSIS — R0789 Other chest pain: Secondary | ICD-10-CM | POA: Insufficient documentation

## 2020-08-24 LAB — BASIC METABOLIC PANEL
Anion gap: 10 (ref 5–15)
BUN: 13 mg/dL (ref 6–20)
CO2: 24 mmol/L (ref 22–32)
Calcium: 9.3 mg/dL (ref 8.9–10.3)
Chloride: 103 mmol/L (ref 98–111)
Creatinine, Ser: 1.08 mg/dL (ref 0.61–1.24)
GFR, Estimated: >60 mL/min (ref >60)
Glucose, Bld: 140 mg/dL — ABNORMAL HIGH (ref 70–99)
Potassium: 3.3 mmol/L — ABNORMAL LOW (ref 3.5–5.1)
Sodium: 137 mmol/L (ref 135–145)

## 2020-08-24 LAB — CBC
HCT: 43 % (ref 39.0–52.0)
Hemoglobin: 14.3 g/dL (ref 13.0–17.0)
MCH: 31.3 pg (ref 26.0–34.0)
MCHC: 33.3 g/dL (ref 30.0–36.0)
MCV: 94.1 fL (ref 80.0–100.0)
Platelets: 226 10*3/uL (ref 150–400)
RBC: 4.57 MIL/uL (ref 4.22–5.81)
RDW: 14.2 % (ref 11.5–15.5)
WBC: 6.8 10*3/uL (ref 4.0–10.5)
nRBC: 0 % (ref 0.0–0.2)

## 2020-08-24 LAB — LIPASE, BLOOD: Lipase: 20 U/L (ref 11–51)

## 2020-08-24 LAB — HEPATIC FUNCTION PANEL
ALT: 21 U/L (ref 0–44)
AST: 25 U/L (ref 15–41)
Albumin: 4.4 g/dL (ref 3.5–5.0)
Alkaline Phosphatase: 60 U/L (ref 38–126)
Bilirubin, Direct: 0.1 mg/dL (ref 0.0–0.2)
Indirect Bilirubin: 0.7 mg/dL (ref 0.3–0.9)
Total Bilirubin: 0.8 mg/dL (ref 0.3–1.2)
Total Protein: 7.8 g/dL (ref 6.5–8.1)

## 2020-08-24 LAB — TROPONIN I (HIGH SENSITIVITY)
Troponin I (High Sensitivity): 14 ng/L (ref <18)
Troponin I (High Sensitivity): 17 ng/L (ref <18)

## 2020-08-24 MED ORDER — LORAZEPAM 1 MG PO TABS
2.0000 mg | ORAL_TABLET | Freq: Once | ORAL | Status: AC
  Administered 2020-08-24: 2 mg via ORAL
  Filled 2020-08-24: qty 2

## 2020-08-24 MED ORDER — ALUM & MAG HYDROXIDE-SIMETH 200-200-20 MG/5ML PO SUSP
30.0000 mL | Freq: Once | ORAL | Status: AC
  Administered 2020-08-24: 30 mL via ORAL
  Filled 2020-08-24: qty 30

## 2020-08-24 MED ORDER — LIDOCAINE VISCOUS HCL 2 % MT SOLN
15.0000 mL | Freq: Once | OROMUCOSAL | Status: AC
  Administered 2020-08-24: 15 mL via ORAL
  Filled 2020-08-24: qty 15

## 2020-08-24 NOTE — ED Provider Notes (Signed)
[facility] Provider Note   CSN: [id number] Arrival date & time: 08/24/20  1616     History Chief Complaint  Patient presents with  . Dysphagia    Chest tightness    Miguel Elliott. is a 45 y.o. male with a history of CHF, hypertension, diabetes, COPD, SVT, cocaine use, GERD.  Patient presents with a chief complaint of dysphagia and chest tightness.  Patient reports that mouth was extremely dry approximately 1600.  He attempted to take his medications but states it felt like they got stuck in his throat.  Patient was able to swallow his pills with water.  Reports that that he has had tightness to his epigastric region.  Patient reports that he has vomited 3 times since then.  Patient describes emesis as clear and yellow.  Patient denies any hematemesis or coffee-ground emesis.  Reports that he has had some difficulty breathing as well.  Patient denies any drooling, change in voice.    Patient also endorses that he developed numbness to his left leg and arm while in the waiting area today.  Patient denies any visual disturbance, headache, facial asymmetry, weakness to extremities.    Patient endorses cocaine and marijuana use approximately 0130 today.  HPI     Past Medical History:  Diagnosis Date  . Abnormal weight loss    30 pounds since 11/2010  . Anxiety   . CHF (congestive heart failure) ([place])   . Cocaine use   . COPD (chronic obstructive pulmonary disease) ([place])   . Depression    no medication  . Diabetes mellitus ([place])   . Esophageal varices (HCC) 12/22/10    grade 1 egd by Dr. Gala Romney  . Gastric erosions 12/22/10    egd by Dr. Gala Romney  . GERD (gastroesophageal reflux disease)   . Hiatal hernia 12/22/10    egd by Dr. Gala Romney  . Hypertension   . Hypertensive heart disease without CHF   . 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 Gala Romney  . Myocardial infarct ([place]) 06/2019  . Portal hypertensive gastropathy   . PTSD (post-traumatic stress disorder)    . SVT (supraventricular tachycardia) ([place])   . Tobacco abuse     Patient Active Problem List   Diagnosis Date Noted  . SVT (supraventricular tachycardia) ([place]) 08/28/2019  . Cocaine use 08/28/2019  . Liver lesion 06/13/2019  . Polysubstance abuse ([place]) 05/24/2019  . Type 2 diabetes mellitus ([place]) 05/24/2019  . Chest pain 05/23/2019  . Hypertension   . GERD (gastroesophageal reflux disease)   . Anxiety with depression   . Hyperglycemia   . Morbid obesity ([place])   . Abdominal pain 01/17/2011  . Weight loss, abnormal 01/17/2011  . Vomiting 01/17/2011  . Hematemesis 12/22/2010  . Hypertensive urgency 12/22/2010  . Headache(784.0) 12/22/2010  . Dehydration 12/22/2010  . Hematuria, microscopic 12/22/2010    Past Surgical History:  Procedure Laterality Date  . ESOPHAGOGASTRODUODENOSCOPY  12/22/2010   Procedure: ESOPHAGOGASTRODUODENOSCOPY (EGD);  Surgeon: Daneil Dolin, MD;  [facility];  Service: Endoscopy;  Laterality: N/A;.    ERE, M-W tear, gastric erosions, bx benign, minimal esophageal variecs, portal gastropathy  . FOOT SURGERY    . NASAL RECONSTRUCTION  2003       Family History  Problem Relation Age of Onset  . Hypertension Father   . Diabetes Father   . Hypertension Paternal Grandmother   . Diabetes Maternal Grandmother   . Colon cancer Neg Hx   .  Ulcers Neg Hx   . Colon polyps Neg Hx     Social History   Tobacco Use  . Smoking status: Current Some Day Smoker    Packs/day: 0.25    Types: Cigarettes  . Smokeless tobacco: Never Used  Vaping Use  . Vaping Use: Never used  Substance Use Topics  . Alcohol use: Not Currently    Alcohol/week: 15.0 standard drinks    Types: 15 Shots of liquor per week    Comment: Drinks a fifth a week- pt denied 06/13/19 "none past 3 weeks"  . Drug use: Yes    Types: Marijuana, Cocaine, IV    Comment: "powder"     Home Medications Prior to Admission medications   Medication Sig Start Date End Date Taking?  Authorizing Provider  amLODipine (NORVASC) 10 MG tablet Take 10 mg by mouth daily. 11/18/19   [provider]  atorvastatin (LIPITOR) 10 MG tablet Take 1 tablet (10 mg total) by mouth daily at 6 PM. 05/24/19   Wynetta Emery, Clanford L, MD  blood glucose meter kit and supplies Dispense based on patient and insurance preference. Use up to four times daily as directed. (FOR ICD-10 E10.9, E11.9). 05/24/19   Johnson, Clanford L, MD  clonazePAM (KLONOPIN) 0.5 MG tablet Take 1 tablet (0.5 mg total) by mouth 2 (two) times daily as needed for anxiety. 07/08/20   Milton Ferguson, MD  lisinopril (ZESTRIL) 20 MG tablet Take 1 tablet (20 mg total) by mouth daily. 08/04/20 11/02/20  Erma Heritage, PA-C  metFORMIN (GLUCOPHAGE XR) 500 MG 24 hr tablet Take 1 tablet (500 mg total) by mouth 2 (two) times daily with a meal. 05/24/19 01/17/20  Johnson, Clanford L, MD  metoprolol tartrate (LOPRESSOR) 25 MG tablet Take 25 mg by mouth 2 (two) times daily. 12/22/19   [provider]  pantoprazole (PROTONIX) 40 MG tablet Take 1 tablet (40 mg total) by mouth daily. 05/25/19   Johnson, Clanford L, MD  spironolactone (ALDACTONE) 50 MG tablet Take 50 mg by mouth daily. 11/18/19   [provider]    Allergies    Patient has no known allergies.  Review of Systems   Review of Systems  Constitutional: Negative for chills and fever.  HENT: Positive for trouble swallowing. Negative for drooling and voice change.   Eyes: Negative for visual disturbance.  Respiratory: Positive for shortness of breath.   Cardiovascular: Negative for chest pain, palpitations and leg swelling.  Gastrointestinal: Positive for nausea and vomiting. Negative for abdominal distention, abdominal pain, anal bleeding, blood in stool, constipation and diarrhea.  Musculoskeletal: Negative for back pain and neck pain.  Skin: Negative for color change and rash.  Neurological: Positive for numbness. Negative for dizziness, tremors, seizures,  syncope, facial asymmetry, speech difficulty, weakness, light-headedness and headaches.  Psychiatric/Behavioral: Negative for confusion.    Physical Exam Updated Vital Signs BP (!) 180/109 (BP Location: Right Arm)   Pulse (!) 118   Temp 98.7 F (37.1 C) (Oral)   Resp 17   Ht '5\' 9"'  (1.753 m)   Wt 122.5 kg   SpO2 100%   BMI 39.87 kg/m   Physical Exam Vitals and nursing note reviewed.  Constitutional:      General: He is not in acute distress.    Appearance: He is not ill-appearing, toxic-appearing or diaphoretic.  HENT:     Head: Normocephalic. No raccoon eyes, abrasion, contusion, masses, right periorbital erythema, left periorbital erythema or laceration.     Jaw: No trismus or pain  on movement.     Comments: Patient able to handle oral secretions without difficulty    Mouth/Throat:     Mouth: Mucous membranes are dry. No angioedema.     Pharynx: Oropharynx is clear. Uvula midline. No pharyngeal swelling, oropharyngeal exudate, posterior oropharyngeal erythema or uvula swelling.     Comments: Poor dentition with multiple dental caries and missing teeth throughout.  No angioedema or swelling noted to floor of mouth Eyes:     General: No scleral icterus.       Right eye: No discharge.        Left eye: No discharge.     Extraocular Movements: Extraocular movements intact.     Pupils: Pupils are equal, round, and reactive to light.  Cardiovascular:     Rate and Rhythm: Normal rate.  Pulmonary:     Effort: Pulmonary effort is normal. No tachypnea, bradypnea or respiratory distress.     Breath sounds: Normal breath sounds. No stridor.     Comments: Patient able speak in full complete sentences without difficulty, oxygen saturation 100% on room air Abdominal:     Palpations: Abdomen is soft.     Tenderness: There is no abdominal tenderness.  Musculoskeletal:     Cervical back: Normal range of motion and neck supple. No rigidity.  Skin:    General: Skin is warm and dry.      Coloration: Skin is not jaundiced or pale.  Neurological:     General: No focal deficit present.     Mental Status: He is alert.     GCS: GCS eye subscore is 4. GCS verbal subscore is 5. GCS motor subscore is 6.     Cranial Nerves: No cranial nerve deficit or facial asymmetry.     Sensory: Sensation is intact.     Motor: No weakness, tremor, seizure activity or pronator drift.     Coordination: Romberg sign negative. Finger-Nose-Finger Test normal.     Gait: Gait is intact. Gait normal.     Comments: CN II-XII intact, equal grip strength, +5 strength to bilateral upper and lower extremities, station to light touch intact to bilateral upper and lower extremities    Psychiatric:        Behavior: Behavior is cooperative.     ED Results / Procedures / Treatments   Labs (all labs ordered are listed, but only abnormal results are displayed) Labs Reviewed  BASIC METABOLIC PANEL - Abnormal; Notable for the following components:      Result Value   Potassium 3.3 (*)    Glucose, Bld 140 (*)    All other components within normal limits  CBC  HEPATIC FUNCTION PANEL  LIPASE, BLOOD  TROPONIN I (HIGH SENSITIVITY)  TROPONIN I (HIGH SENSITIVITY)    EKG EKG Interpretation  Date/Time:  Tuesday Aug 24 2020 16:38:07 EDT Ventricular Rate:  133 PR Interval:  130 QRS Duration: 72 QT Interval:  316 QTC Calculation: 470 R Axis:   -1 Text Interpretation: Sinus tachycardia Septal infarct , age undetermined Abnormal ECG since last tracing - T wave abnormalities have resolved Confirmed by Noemi Chapel [id number]) on 08/24/2020 5:13:38 PM   Radiology DG Chest Portable 1 View  Result Date: 08/24/2020 CLINICAL DATA:  Chest pain.  Chest tightness. EXAM: PORTABLE CHEST 1 VIEW COMPARISON:  07/08/2020 FINDINGS: Mild chronic elevation of right hemidiaphragm.The cardiomediastinal contours are normal. The lungs are clear. Pulmonary vasculature is normal. No consolidation, pleural effusion, or pneumothorax. No  acute osseous abnormalities are seen. IMPRESSION: No acute  chest findings. Electronically Signed   By: Keith Rake M.D.   On: 08/24/2020 17:42    Procedures Procedures   Medications Ordered in ED Medications  alum & mag hydroxide-simeth (MAALOX/MYLANTA) 200-200-20 MG/5ML suspension 30 mL (30 mLs Oral Given 08/24/20 1800)    And  lidocaine (XYLOCAINE) 2 % viscous mouth solution 15 mL (15 mLs Oral Given 08/24/20 1800)  LORazepam (ATIVAN) tablet 2 mg (2 mg Oral Given 08/24/20 1800)    ED Course  I have reviewed the triage vital signs and the nursing notes.  Pertinent labs & imaging results that were available during my care of the patient were reviewed by me and considered in my medical decision making (see chart for details).    MDM Rules/Calculators/A&P                          Alert 44 year old male in no acute distress, nontoxic-appearing.  Patient presents with chief complaint of dysphagia, epigastric tightness and numbness to left extremities.  Patient is able to stand and ambulate without difficulty.  Able to handle oral secretions without difficulty.  Patient speaks in full complete sentences without difficulty.  Saturation 100% on room air.  On physical exam patient has no focal neurological deficits.  Sensation to light touch intact to bilateral upper and lower extremities.  Low suspicion for CVA at this time.  Suspect that patient's epigastric tightness is secondary to pill esophagitis versus cocaine use.  Give patient Ativan as well as GI cocktail.  Will also obtain troponin, EKG, and chest x-ray to evaluate for possible cardiac involvement.  CBC, BMP, hepatic function, and lipase ordered as well.  CBC, reticulocyte function and lipase unremarkable.  BMP shows potassium slightly decreased at 3.3 likely secondary to his vomiting.  Patient reports complete resolution of his epigastric tightness.  Patient was able to handle GI cocktail without difficulty.  Still endorses dry  mouth at this time and is asking for water.    EKG shows sinus tachycardia.  Chest x-ray shows no active cardiopulmonary disease.  Initial troponin 14.  Will obtain second troponin and plan to discharge if within normal limits.  Second troponin 17 with delta of +3.  Patient continues to endorse resolution of his epigastric tightness.  Patient is hemodynamically stable.  Will discharge patient to follow-up with his primary care provider.  Patient given information on substance use counseling.  Patient instructions to avoid driving or operating heavy machinery for the next 24 hours due to sedating medication.  Discussed results, findings, treatment and follow up. Patient advised of return precautions. Patient verbalized understanding and agreed with plan.     Final Clinical Impression(s) / ED Diagnoses Final diagnoses:  Epigastric abdominal pain  Dysphagia, unspecified type  Cocaine use    Rx / DC Orders ED Discharge Orders    None       Dyann Ruddle 08/24/20 2137    Noemi Chapel, MD 08/25/20 [phone]

## 2020-08-24 NOTE — ED Triage Notes (Signed)
Pt dysphagia today around 1500.   Chest tightness since 1600 today.  Left leg numbness for past few minutes.

## 2020-08-24 NOTE — ED Triage Notes (Signed)
Pt admits to using cocaine today.

## 2020-08-24 NOTE — Discharge Instructions (Signed)
You came to the [facility] department today to be evaluated for your trouble swallowing and epigastric tightness.  Your physical exam and lab work were reassuring that you are not having acute heart attack today.  Please follow-up with your primary care provider.  I have given you a resource guide for counseling/substance use.  Please refrain from using cocaine and marijuana.  Get help right away if: You cannot swallow your saliva. You have shortness of breath, a fever, or both. Your voice is hoarse and you have trouble swallowing.

## 2020-09-17 ENCOUNTER — Ambulatory Visit: Payer: BC Managed Care – PPO | Admitting: Student

## 2020-09-17 NOTE — Progress Notes (Deleted)
Cardiology Office Note    Date:  09/17/2020   ID:  Miguel Longs., DOB [birth year], MRN [id number]  PCP:  Abran Richard, MD  Cardiologist: Rozann Lesches, MD    No chief complaint on file.   History of Present Illness:    Miguel Nall. is a 45 y.o. male with past medical history of SVT, HTN, Type 2 DM, GERD, esophageal varices, Mallory-Weiss tear, PTSD and substance use (cocaine and tobacco use) who presents to the office today for 45-monthfollow-up.  He was last examined myself in 08/2020 and had recently been evaluated in the ED for dyspnea which is felt to be secondary to asthma or COPD as his B MP and D-dimer were normal.  At the time of his office visit, he reported his breathing had significantly improved since using as needed albuterol and he denied any associated symptoms.  Blood pressure was significantly elevated at 180/100 during his visit and he was continued on amlodipine 10 mg daily, Lopressor 25 mg twice daily and Spironolactone 50 mg daily with Lisinopril being titrated from 10 mg daily to 20 mg daily.  Was recommended to have a repeat BMET in 2 weeks. PFT's were also recommended given his worsening dyspnea.  He did present back to the ED on 08/24/2020 for evaluation of dysphagia and chest tightness.  He reported numbness to his left leg and arm which also occurred while in the ED and reported using cocaine and marijuana earlier in the day.  His chest discomfort improved with administration of a GI cocktail and troponin values were negative at 14 and 17.  EKG was without acute ST changes.  Is recommended he follow-up with his PCP for further evaluation.   Past Medical History:  Diagnosis Date   Abnormal weight loss    30 pounds since 11/2010   Anxiety    CHF (congestive heart failure) (HCC)    Cocaine use    COPD (chronic obstructive pulmonary disease) (HCC)    Depression    no medication   Diabetes mellitus ([place]    [place]    grade  1 egd by Dr. RGala Romney  Gastric erosions 12/22/10    egd by Dr. RGala Romney  GERD (gastroesophageal reflux disease)    Hiatal hernia 12/22/10    egd by Dr. RGala Romney  Hypertension    Hypertensive heart disease without CHF    Mallory - Weiss tear 12/22/10   egd by Dr. RGala Romney  Myocardial infarct ([facility] 06/2019   Portal hypertensive gastropathy    PTSD (post-traumatic stress disorder)    SVT (supraventricular tachycardia) (HCC)    Tobacco abuse     Past Surgical History:  Procedure Laterality Date   ESOPHAGOGASTRODUODENOSCOPY  12/22/2010   Procedure: ESOPHAGOGASTRODUODENOSCOPY (EGD);  Surgeon: RDaneil Dolin MD;  [facility];  Service: Endoscopy;  Laterality: N/A;.    ERE, M-W tear, gastric erosions, bx benign, minimal esophageal variecs, portal gastropathy   FOOT SURGERY     NASAL RECONSTRUCTION  2003    Current Medications: Outpatient Medications Prior to Visit  Medication Sig Dispense Refill   amLODipine (NORVASC) 10 MG tablet Take 10 mg by mouth daily.     atorvastatin (LIPITOR) 10 MG tablet Take 1 tablet (10 mg total) by mouth daily at 6 PM. 30 tablet 0   blood glucose meter kit and supplies Dispense based on patient and insurance preference. Use up to four times daily as directed. (FOR ICD-10  E10.9, E11.9). 1 each 0   clonazePAM (KLONOPIN) 0.5 MG tablet Take 1 tablet (0.5 mg total) by mouth 2 (two) times daily as needed for anxiety. 20 tablet 0   lisinopril (ZESTRIL) 20 MG tablet Take 1 tablet (20 mg total) by mouth daily. 90 tablet 3   metFORMIN (GLUCOPHAGE XR) 500 MG 24 hr tablet Take 1 tablet (500 mg total) by mouth 2 (two) times daily with a meal. 60 tablet 1   metoprolol tartrate (LOPRESSOR) 25 MG tablet Take 25 mg by mouth 2 (two) times daily.     pantoprazole (PROTONIX) 40 MG tablet Take 1 tablet (40 mg total) by mouth daily. 30 tablet 1   spironolactone (ALDACTONE) 50 MG tablet Take 50 mg by mouth daily.     No facility-administered medications prior to visit.      Allergies:   Patient has no known allergies.   Social History   Socioeconomic History   Marital status: Single    Spouse name: Not on file   Number of children: 2   Years of education: Not on file   Highest education level: Not on file  Occupational History   Occupation: unemployed  Tobacco Use   Smoking status: Some Days    Packs/day: 0.25    Pack years: 0.00    Types: Cigarettes   Smokeless tobacco: Never  Vaping Use   Vaping Use: Never used  Substance and Sexual Activity   Alcohol use: Not Currently    Alcohol/week: 15.0 standard drinks    Types: 15 Shots of liquor per week    Comment: Drinks a fifth a week- pt denied 06/13/19 "none past 3 weeks"   Drug use: Yes    Types: Marijuana, Cocaine, IV    Comment: "powder"    Sexual activity: Yes  Other Topics Concern   Not on file  Social History Narrative   Not on file   Social Determinants of Health   Financial Resource Strain: Not on file  Food Insecurity: Not on file  Transportation Needs: Not on file  Physical Activity: Not on file  Stress: Not on file  Social Connections: Not on file     Family History:  The patient's ***family history includes Diabetes in his father and maternal grandmother; Hypertension in his father and paternal grandmother.   Review of Systems:    Please see the history of present illness.     All other systems reviewed and are otherwise negative except as noted above.   Physical Exam:    VS:  There were no vitals taken for this visit.   General: Well developed, well nourished,male appearing in no acute distress. Head: Normocephalic, atraumatic. Neck: No carotid bruits. JVD not elevated.  Lungs: Respirations regular and unlabored, without wheezes or rales.  Heart: ***Regular rate and rhythm. No S3 or S4.  No murmur, no rubs, or gallops appreciated. Abdomen: Appears non-distended. No obvious abdominal masses. Msk:  Strength and tone appear normal for age. No obvious joint  deformities or effusions. Extremities: No clubbing or cyanosis. No edema.  Distal pedal pulses are 2+ bilaterally. Neuro: Alert and oriented X 3. Moves all extremities spontaneously. No focal deficits noted. Psych:  Responds to questions appropriately with a normal affect. Skin: No rashes or lesions noted  Wt Readings from Last 3 Encounters:  08/24/20 270 lb (122.5 kg)  08/04/20 265 lb (120.2 kg)  07/08/20 240 lb (108.9 kg)        Studies/Labs Reviewed:   EKG:  EKG is***  ordered today.  The ekg ordered today demonstrates ***  Recent Labs: 07/08/2020: B Natriuretic Peptide 34.0 08/24/2020: ALT 21; BUN 13; Creatinine, Ser 1.08; Hemoglobin 14.3; Platelets 226; Potassium 3.3; Sodium 137   Lipid Panel    Component Value Date/Time   CHOL 143 05/24/2019 0617   TRIG 119 05/24/2019 0617   HDL 39 (L) 05/24/2019 0617   CHOLHDL 3.7 05/24/2019 0617   VLDL 24 05/24/2019 0617   LDLCALC 80 05/24/2019 0617    Additional studies/ records that were reviewed today include:   Echocardiogram: 05/2019 IMPRESSIONS     1. Left ventricular ejection fraction, by estimation, is 50 to 55%. The  left ventricle has low normal function. The left ventricle has no regional  wall motion abnormalities. There is moderate left ventricular hypertrophy.  Left ventricular diastolic  parameters are consistent with Grade I diastolic dysfunction (impaired  relaxation).   2. Right ventricular systolic function is normal. The right ventricular  size is normal. There is normal pulmonary artery systolic pressure. The  estimated right ventricular systolic pressure is 97.7 mmHg.   3. The mitral valve is grossly normal. Trivial mitral valve  regurgitation.   4. The aortic valve is tricuspid. Aortic valve regurgitation is not  visualized.   5. The inferior vena cava is normal in size with greater than 50%  respiratory variability, suggesting right atrial pressure of 3 mmHg.   Assessment:    No diagnosis  found.   Plan:   In order of problems listed above:      Shared Decision Making/Informed Consent:   {Are you ordering a CV Procedure (e.g. stress test, cath, DCCV, TEE, etc)?   Press F2        :[id number]}    Medication Adjustments/Labs and Tests Ordered: Current medicines are reviewed at length with the patient today.  Concerns regarding medicines are outlined above.  Medication changes, Labs and Tests ordered today are listed in the Patient Instructions below. There are no Patient Instructions on file for this visit.   Signed, Erma Heritage, PA-C  09/17/2020 7:40 AM    [facility] [address] [place], [place] [address] Phone: [phone] Fax: [phone]

## 2021-11-20 ENCOUNTER — Emergency Department (HOSPITAL_COMMUNITY)
Admission: EM | Admit: 2021-11-20 | Discharge: 2021-11-20 | Attending: Emergency Medicine | Admitting: Emergency Medicine

## 2021-11-20 ENCOUNTER — Encounter (HOSPITAL_COMMUNITY): Payer: Self-pay | Admitting: *Deleted

## 2021-11-20 ENCOUNTER — Emergency Department (HOSPITAL_COMMUNITY)

## 2021-11-20 ENCOUNTER — Other Ambulatory Visit: Payer: Self-pay

## 2021-11-20 DIAGNOSIS — I10 Essential (primary) hypertension: Secondary | ICD-10-CM | POA: Diagnosis not present

## 2021-11-20 DIAGNOSIS — Z79899 Other long term (current) drug therapy: Secondary | ICD-10-CM | POA: Diagnosis not present

## 2021-11-20 DIAGNOSIS — R079 Chest pain, unspecified: Secondary | ICD-10-CM | POA: Diagnosis present

## 2021-11-20 LAB — CBC WITH DIFFERENTIAL/PLATELET
Abs Immature Granulocytes: 0.02 10*3/uL (ref 0.00–0.07)
Basophils Absolute: 0 10*3/uL (ref 0.0–0.1)
Basophils Relative: 1 %
Eosinophils Absolute: 0.1 10*3/uL (ref 0.0–0.5)
Eosinophils Relative: 1 %
HCT: 45.6 % (ref 39.0–52.0)
Hemoglobin: 15.2 g/dL (ref 13.0–17.0)
Immature Granulocytes: 0 %
Lymphocytes Relative: 26 %
Lymphs Abs: 1.9 10*3/uL (ref 0.7–4.0)
MCH: 30.8 pg (ref 26.0–34.0)
MCHC: 33.3 g/dL (ref 30.0–36.0)
MCV: 92.3 fL (ref 80.0–100.0)
Monocytes Absolute: 0.6 10*3/uL (ref 0.1–1.0)
Monocytes Relative: 8 %
Neutro Abs: 4.8 10*3/uL (ref 1.7–7.7)
Neutrophils Relative %: 64 %
Platelets: 238 10*3/uL (ref 150–400)
RBC: 4.94 MIL/uL (ref 4.22–5.81)
RDW: 14.3 % (ref 11.5–15.5)
WBC: 7.4 10*3/uL (ref 4.0–10.5)
nRBC: 0 % (ref 0.0–0.2)

## 2021-11-20 LAB — COMPREHENSIVE METABOLIC PANEL
ALT: 19 U/L (ref 0–44)
AST: 17 U/L (ref 15–41)
Albumin: 3.7 g/dL (ref 3.5–5.0)
Alkaline Phosphatase: 66 U/L (ref 38–126)
Anion gap: 6 (ref 5–15)
BUN: 9 mg/dL (ref 6–20)
CO2: 25 mmol/L (ref 22–32)
Calcium: 8.9 mg/dL (ref 8.9–10.3)
Chloride: 104 mmol/L (ref 98–111)
Creatinine, Ser: 0.85 mg/dL (ref 0.61–1.24)
GFR, Estimated: >60 mL/min (ref >60)
Glucose, Bld: 196 mg/dL — ABNORMAL HIGH (ref 70–99)
Potassium: 3.9 mmol/L (ref 3.5–5.1)
Sodium: 135 mmol/L (ref 135–145)
Total Bilirubin: 0.6 mg/dL (ref 0.3–1.2)
Total Protein: 7.2 g/dL (ref 6.5–8.1)

## 2021-11-20 LAB — TROPONIN I (HIGH SENSITIVITY)
Troponin I (High Sensitivity): 16 ng/L (ref <18)
Troponin I (High Sensitivity): 14 ng/L (ref <18)

## 2021-11-20 MED ORDER — ASPIRIN 325 MG PO TABS
325.0000 mg | ORAL_TABLET | Freq: Once | ORAL | Status: AC
Start: 2021-11-20 — End: 2021-11-20
  Administered 2021-11-20: 325 mg via ORAL
  Filled 2021-11-20: qty 1

## 2021-11-20 MED ORDER — AMLODIPINE BESYLATE 10 MG PO TABS: 10.0000 mg | ORAL_TABLET | Freq: Every day | ORAL | 0 refills | Status: DC

## 2021-11-20 MED ORDER — AMLODIPINE BESYLATE 5 MG PO TABS
10.0000 mg | ORAL_TABLET | Freq: Once | ORAL | Status: AC
Start: 2021-11-20 — End: 2021-11-20
  Administered 2021-11-20: 10 mg via ORAL
  Filled 2021-11-20: qty 2

## 2021-11-20 MED ORDER — LISINOPRIL 20 MG PO TABS: 20.0000 mg | ORAL_TABLET | Freq: Every day | ORAL | 0 refills | Status: DC

## 2021-11-20 MED ORDER — NITROGLYCERIN 0.4 MG SL SUBL
0.4000 mg | SUBLINGUAL_TABLET | Freq: Once | SUBLINGUAL | Status: AC
  Administered 2021-11-20: 0.4 mg via SUBLINGUAL
  Filled 2021-11-20: qty 1

## 2021-11-20 MED ORDER — LABETALOL HCL 5 MG/ML IV SOLN
20.0000 mg | Freq: Once | INTRAVENOUS | Status: AC
  Administered 2021-11-20: 20 mg via INTRAVENOUS
  Filled 2021-11-20: qty 4

## 2021-11-20 NOTE — Discharge Instructions (Signed)
Make arrangements to get the rest your medicines

## 2021-11-20 NOTE — ED Triage Notes (Signed)
Pt with chest pain since eating supper, + SOB, denies any N/V, + lightheadness

## 2021-11-20 NOTE — ED Notes (Signed)
Pt given crackers, peanut butter, and soda per Dr Roderic Palau ok, pt given granola bar from this nurses lunch as we do not have any sandwich bags or snacks for pt in ED

## 2021-11-20 NOTE — ED Provider Notes (Signed)
[facility] EMERGENCY DEPARTMENT Provider Note   CSN: [id number] Arrival date & time: 11/20/21  1842     History {Add pertinent medical, surgical, social history, OB history to HPI:1} Chief Complaint  Patient presents with   Chest Pain    Miguel Elliott. is a 46 y.o. male.  Patient presents with chest pain.  Patient has not been taking his blood pressure medicines.   Chest Pain      Home Medications Prior to Admission medications   Medication Sig Start Date End Date Taking? Authorizing Provider  amLODipine (NORVASC) 10 MG tablet Take 1 tablet (10 mg total) by mouth daily. 11/20/21  Yes Milton Ferguson, MD  lisinopril (ZESTRIL) 20 MG tablet Take 1 tablet (20 mg total) by mouth daily. 11/20/21  Yes Milton Ferguson, MD  atorvastatin (LIPITOR) 10 MG tablet Take 1 tablet (10 mg total) by mouth daily at 6 PM. 05/24/19   Murlean Iba, MD  blood glucose meter kit and supplies Dispense based on patient and insurance preference. Use up to four times daily as directed. (FOR ICD-10 E10.9, E11.9). 05/24/19   Johnson, Clanford L, MD  clonazePAM (KLONOPIN) 0.5 MG tablet Take 1 tablet (0.5 mg total) by mouth 2 (two) times daily as needed for anxiety. 07/08/20   Milton Ferguson, MD  metFORMIN (GLUCOPHAGE XR) 500 MG 24 hr tablet Take 1 tablet (500 mg total) by mouth 2 (two) times daily with a meal. 05/24/19 01/17/20  Johnson, Clanford L, MD  metoprolol tartrate (LOPRESSOR) 25 MG tablet Take 25 mg by mouth 2 (two) times daily. 12/22/19   [provider]  pantoprazole (PROTONIX) 40 MG tablet Take 1 tablet (40 mg total) by mouth daily. 05/25/19   Johnson, Clanford L, MD  spironolactone (ALDACTONE) 50 MG tablet Take 50 mg by mouth daily. 11/18/19   [provider]      Allergies    Patient has no known allergies.    Review of Systems   Review of Systems  Cardiovascular:  Positive for chest pain.    Physical Exam Updated Vital Signs BP (!) 173/92   Pulse 65   Temp 98.1 F  (36.7 C) (Oral)   Resp (!) 21   Ht _0  (1.753 m)   Wt 127.5 kg   SpO2 98%   BMI 41.50 kg/m  Physical Exam  ED Results / Procedures / Treatments   Labs (all labs ordered are listed, but only abnormal results are displayed) Labs Reviewed  COMPREHENSIVE METABOLIC PANEL - Abnormal; Notable for the following components:      Result Value   Glucose, Bld 196 (*)    All other components within normal limits  CBC WITH DIFFERENTIAL/PLATELET  TROPONIN I (HIGH SENSITIVITY)  TROPONIN I (HIGH SENSITIVITY)    EKG EKG Interpretation  Date/Time:  Sunday November 20 2021 18:52:54 EDT Ventricular Rate:  78 PR Interval:  120 QRS Duration: 90 QT Interval:  392 QTC Calculation: 446 R Axis:   -7 Text Interpretation: Sinus rhythm with occasional Premature ventricular complexes Cannot rule out Anteroseptal infarct (cited on or before 24-Aug-2020) Abnormal ECG When compared with ECG of 24-Aug-2020 16:38, Premature ventricular complexes are now Present Vent. rate has decreased BY  55 BPM ST elevation now present in Anterior leads T wave inversion now evident in Inferior leads Confirmed by Milton Ferguson [phone]) on 11/20/2021 7:01:08 PM  Radiology DG Chest Port 1 View  Result Date: 11/20/2021 CLINICAL DATA:  Chest pain. EXAM: PORTABLE CHEST 1 VIEW COMPARISON:  Chest  radiograph dated 08/24/2020. FINDINGS: No focal consolidation, pleural effusion, or pneumothorax. The cardiac silhouette is within normal limits. No acute osseous pathology. IMPRESSION: No active disease. Electronically Signed   By: Anner Crete M.D.   On: 11/20/2021 20:09    Procedures Procedures  {Document cardiac monitor, telemetry assessment procedure when appropriate:1}  Medications Ordered in ED Medications  nitroGLYCERIN (NITROSTAT) SL tablet 0.4 mg (0.4 mg Sublingual Given 11/20/21 1947)  amLODipine (NORVASC) tablet 10 mg (10 mg Oral Given 11/20/21 1946)  labetalol (NORMODYNE) injection 20 mg (20 mg Intravenous Given 11/20/21  2128)  aspirin tablet 325 mg (325 mg Oral Given 11/20/21 1946)    ED Course/ Medical Decision Making/ A&P  CRITICAL CARE Performed by: Milton Ferguson Total critical care time: 40 minutes Critical care time was exclusive of separately billable procedures and treating other patients. Critical care was necessary to treat or prevent imminent or life-threatening deterioration. Critical care was time spent personally by me on the following activities: development of treatment plan with patient and/or surrogate as well as nursing, discussions with consultants, evaluation of patient's response to treatment, examination of patient, obtaining history from patient or surrogate, ordering and performing treatments and interventions, ordering and review of laboratory studies, ordering and review of radiographic studies, pulse oximetry and re-evaluation of patient's condition.                          Medical Decision Making Amount and/or Complexity of Data Reviewed Labs: ordered. Radiology: ordered. ECG/medicine tests: ordered.  Risk OTC drugs. Prescription drug management.   Patient with hypertensive urgency and chest discomfort.  He started back on blood pressure medicines and will follow-up as an outpatient  {Document critical care time when appropriate:1} {Document review of labs and clinical decision tools ie heart score, Chads2Vasc2 etc:1}  {Document your independent review of radiology images, and any outside records:1} {Document your discussion with family members, caretakers, and with consultants:1} {Document social determinants of health affecting pt's care:1} {Document your decision making why or why not admission, treatments were needed:1} Final Clinical Impression(s) / ED Diagnoses Final diagnoses:  Primary hypertension    Rx / DC Orders ED Discharge Orders          Ordered    amLODipine (NORVASC) 10 MG tablet  Daily        11/20/21 2323    lisinopril (ZESTRIL) 20 MG tablet   Daily        11/20/21 2323

## 2021-11-23 ENCOUNTER — Encounter (HOSPITAL_COMMUNITY): Payer: Self-pay

## 2021-11-23 ENCOUNTER — Encounter (HOSPITAL_COMMUNITY): Payer: Self-pay | Admitting: Emergency Medicine

## 2021-11-23 ENCOUNTER — Emergency Department (HOSPITAL_COMMUNITY)

## 2021-11-23 ENCOUNTER — Ambulatory Visit (HOSPITAL_COMMUNITY): Admit: 2021-11-23 | Admitting: Cardiovascular Disease

## 2021-11-23 ENCOUNTER — Emergency Department (HOSPITAL_COMMUNITY)
Admission: EM | Admit: 2021-11-23 | Discharge: 2021-11-23 | Disposition: A | Discharge status: Home / Self Care | Attending: Emergency Medicine | Admitting: Emergency Medicine

## 2021-11-23 ENCOUNTER — Other Ambulatory Visit: Payer: Self-pay

## 2021-11-23 DIAGNOSIS — M542 Cervicalgia: Secondary | ICD-10-CM | POA: Insufficient documentation

## 2021-11-23 DIAGNOSIS — R61 Generalized hyperhidrosis: Secondary | ICD-10-CM | POA: Insufficient documentation

## 2021-11-23 DIAGNOSIS — Z79899 Other long term (current) drug therapy: Secondary | ICD-10-CM | POA: Insufficient documentation

## 2021-11-23 DIAGNOSIS — R109 Unspecified abdominal pain: Secondary | ICD-10-CM | POA: Insufficient documentation

## 2021-11-23 DIAGNOSIS — I10 Essential (primary) hypertension: Secondary | ICD-10-CM | POA: Diagnosis not present

## 2021-11-23 DIAGNOSIS — R0789 Other chest pain: Secondary | ICD-10-CM | POA: Diagnosis present

## 2021-11-23 DIAGNOSIS — R531 Weakness: Secondary | ICD-10-CM | POA: Diagnosis not present

## 2021-11-23 DIAGNOSIS — R072 Precordial pain: Secondary | ICD-10-CM

## 2021-11-23 LAB — CBC
HCT: 47.3 % (ref 39.0–52.0)
Hemoglobin: 15.9 g/dL (ref 13.0–17.0)
MCH: 31.1 pg (ref 26.0–34.0)
MCHC: 33.6 g/dL (ref 30.0–36.0)
MCV: 92.4 fL (ref 80.0–100.0)
Platelets: 255 10*3/uL (ref 150–400)
RBC: 5.12 MIL/uL (ref 4.22–5.81)
RDW: 14.2 % (ref 11.5–15.5)
WBC: 5.8 10*3/uL (ref 4.0–10.5)
nRBC: 0 % (ref 0.0–0.2)

## 2021-11-23 LAB — COMPREHENSIVE METABOLIC PANEL
ALT: 21 U/L (ref 0–44)
AST: 19 U/L (ref 15–41)
Albumin: 3.6 g/dL (ref 3.5–5.0)
Alkaline Phosphatase: 64 U/L (ref 38–126)
Anion gap: 7 (ref 5–15)
BUN: 10 mg/dL (ref 6–20)
CO2: 25 mmol/L (ref 22–32)
Calcium: 9.3 mg/dL (ref 8.9–10.3)
Chloride: 104 mmol/L (ref 98–111)
Creatinine, Ser: 0.92 mg/dL (ref 0.61–1.24)
GFR, Estimated: >60 mL/min (ref >60)
Glucose, Bld: 165 mg/dL — ABNORMAL HIGH (ref 70–99)
Potassium: 4.6 mmol/L (ref 3.5–5.1)
Sodium: 136 mmol/L (ref 135–145)
Total Bilirubin: 0.8 mg/dL (ref 0.3–1.2)
Total Protein: 7 g/dL (ref 6.5–8.1)

## 2021-11-23 LAB — RAPID URINE DRUG SCREEN, HOSP PERFORMED
Amphetamines: NOT DETECTED
Barbiturates: NOT DETECTED
Benzodiazepines: NOT DETECTED
Cocaine: NOT DETECTED
Opiates: NOT DETECTED
Tetrahydrocannabinol: NOT DETECTED

## 2021-11-23 LAB — TROPONIN I (HIGH SENSITIVITY)
Troponin I (High Sensitivity): 8 ng/L (ref <18)
Troponin I (High Sensitivity): 9 ng/L (ref <18)

## 2021-11-23 SURGERY — LEFT HEART CATH AND CORONARY ANGIOGRAPHY
Anesthesia: LOCAL

## 2021-11-23 MED ORDER — AMLODIPINE BESYLATE 5 MG PO TABS
10.0000 mg | ORAL_TABLET | Freq: Every day | ORAL | Status: DC
  Administered 2021-11-23: 10 mg via ORAL
  Filled 2021-11-23: qty 2

## 2021-11-23 MED ORDER — IOHEXOL 350 MG/ML SOLN
100.0000 mL | Freq: Once | INTRAVENOUS | Status: AC | PRN
  Administered 2021-11-23: 100 mL via INTRAVENOUS

## 2021-11-23 MED ORDER — LISINOPRIL 20 MG PO TABS
20.0000 mg | ORAL_TABLET | Freq: Every day | ORAL | Status: DC
  Administered 2021-11-23: 20 mg via ORAL
  Filled 2021-11-23: qty 1

## 2021-11-23 MED ORDER — ACETAMINOPHEN 325 MG PO TABS
650.0000 mg | ORAL_TABLET | Freq: Once | ORAL | Status: AC
  Administered 2021-11-23: 650 mg via ORAL
  Filled 2021-11-23: qty 2

## 2021-11-23 NOTE — ED Triage Notes (Signed)
Pt BIB [facility] for code stemi. 3 days ago pt was seen by EMS for BP issues, [place] and was weak and diaphoretic with L sided CP radiating down L arm. Hx previous MI, last was 2021. Given 3 ntg and 1 inch ntg paste 20 mins pta. Intiial BP 220/160- now 126/96.

## 2021-11-23 NOTE — Discharge Instructions (Addendum)
It was our pleasure to provide your ER care today - we hope that you feel better.  Your blood pressure was high - take your blood pressure meds as prescribed, limit salt intake, eat heart healthy meal plan, and follow up closely with primary care doctor in the next 2-3 days.   For earlier chest pain, follow up closely with cardiologist in the coming week.  Your ct scan made show no acute process. Incidental note was made of: There is 3 cm low-density structure in the right lobe of liver, most likely a benign process such as hemangioma. Possible renal cysts - discuss with primary care doctor and have them follow up.   Return to ER if worse, new symptoms, new/severe pain, persistent or recurrent chest pain, trouble breathing, or other concern.

## 2021-11-23 NOTE — ED Provider Notes (Signed)
Patient signed to me by Dr. Vear Clock pending results of second troponin which was negative.  He is stable for discharge   Lacretia Leigh, MD 11/23/21 2029

## 2021-11-23 NOTE — ED Provider Notes (Signed)
[facility] EMERGENCY DEPARTMENT Provider Note   CSN: [id number] Arrival date & time: 11/23/21  1347     History  Chief Complaint  Patient presents with   Chest Pain   Hypertension    Miguel Elliott. is a 46 y.o. male.  Patient with c/o mid to left chest pain and neck pain. Symptoms acute onset earlier today, was walking, felt generally weak and sweaty. No sob. No abd pain. No vomiting. States bp was high. EMS was called - gave ntg. Came to ED as Code stemi activation, but cardiology evaluated on arrival, indicates ecg unchanged, cancelled code stemi, and requested ed eval. Pt denies other recent chest pain or exertional cp. No constant and/or pleuritic chest pain. No leg pain or swelling. No fam hx premature cad. +hx cocaine use, denies recent. States hx htn, but in jail had not be receiving meds regularly - states now is back on 2 of his 3 meds, but does not recall name of meds. Denies cough or uri symptoms. No fever or chills. No back/flank pain.   The history is provided by the patient, medical records and the EMS personnel.       Home Medications Prior to Admission medications   Medication Sig Start Date End Date Taking? Authorizing Provider  amLODipine (NORVASC) 10 MG tablet Take 1 tablet (10 mg total) by mouth daily. 11/20/21   Milton Ferguson, MD  atorvastatin (LIPITOR) 10 MG tablet Take 1 tablet (10 mg total) by mouth daily at 6 PM. 05/24/19   Murlean Iba, MD  blood glucose meter kit and supplies Dispense based on patient and insurance preference. Use up to four times daily as directed. (FOR ICD-10 E10.9, E11.9). 05/24/19   Johnson, Clanford L, MD  clonazePAM (KLONOPIN) 0.5 MG tablet Take 1 tablet (0.5 mg total) by mouth 2 (two) times daily as needed for anxiety. 07/08/20   Milton Ferguson, MD  lisinopril (ZESTRIL) 20 MG tablet Take 1 tablet (20 mg total) by mouth daily. 11/20/21   Milton Ferguson, MD  metFORMIN (GLUCOPHAGE XR) 500 MG 24 hr tablet Take 1  tablet (500 mg total) by mouth 2 (two) times daily with a meal. 05/24/19 01/17/20  Johnson, Clanford L, MD  metoprolol tartrate (LOPRESSOR) 25 MG tablet Take 25 mg by mouth 2 (two) times daily. 12/22/19   [provider]  pantoprazole (PROTONIX) 40 MG tablet Take 1 tablet (40 mg total) by mouth daily. 05/25/19   Johnson, Clanford L, MD  spironolactone (ALDACTONE) 50 MG tablet Take 50 mg by mouth daily. 11/18/19   [provider]      Allergies    Patient has no known allergies.    Review of Systems   Review of Systems  Constitutional:  Negative for chills and fever.  HENT:  Negative for sore throat.   Eyes:  Negative for redness.  Respiratory:  Negative for cough.   Cardiovascular:  Positive for chest pain. Negative for palpitations and leg swelling.  Gastrointestinal:  Negative for abdominal pain and vomiting.  Genitourinary:  Negative for flank pain.  Musculoskeletal:  Negative for back pain and neck pain.  Skin:  Negative for rash.  Neurological:  Negative for syncope, speech difficulty, weakness, numbness and headaches.  Hematological:  Does not bruise/bleed easily.  Psychiatric/Behavioral:  Negative for confusion.     Physical Exam Updated Vital Signs BP (!) 141/70   Pulse 64   Temp 98.2 F (36.8 C) (Oral)   Resp 16   Ht  1.753 m ('5\' 9"' )   Wt 127 kg   SpO2 99%   BMI 41.35 kg/m  Physical Exam Vitals and nursing note reviewed.  Constitutional:      Appearance: Normal appearance. He is well-developed.  HENT:     Head: Atraumatic.     Nose: Nose normal.     Mouth/Throat:     Mouth: Mucous membranes are moist.     Pharynx: Oropharynx is clear.  Eyes:     General: No scleral icterus.    Conjunctiva/sclera: Conjunctivae normal.     Pupils: Pupils are equal, round, and reactive to light.  Neck:     Vascular: No carotid bruit.     Trachea: No tracheal deviation.     Comments: Normal movement, no stiffness or rigidity. Cardiovascular:     Rate and  Rhythm: Normal rate and regular rhythm.     Pulses: Normal pulses.     Heart sounds: Normal heart sounds. No murmur heard.    No friction rub. No gallop.  Pulmonary:     Effort: Pulmonary effort is normal. No accessory muscle usage or respiratory distress.     Breath sounds: Normal breath sounds.  Chest:     Chest wall: No tenderness.  Abdominal:     General: Bowel sounds are normal. There is no distension.     Palpations: Abdomen is soft. There is no mass.     Tenderness: There is no abdominal tenderness. There is no guarding.     Comments: No bruits.   Genitourinary:    Comments: No cva tenderness. Musculoskeletal:        General: No swelling or tenderness.     Cervical back: Normal range of motion and neck supple. No rigidity or tenderness.     Right lower leg: No edema.     Left lower leg: No edema.  Skin:    General: Skin is warm and dry.     Findings: No rash.  Neurological:     Mental Status: He is alert.     Comments: Alert, speech clear. Motor/sens grossly intact bil.   Psychiatric:        Mood and Affect: Mood normal.     ED Results / Procedures / Treatments   Labs (all labs ordered are listed, but only abnormal results are displayed) Results for orders placed or performed during the hospital encounter of 11/23/21  Comprehensive metabolic panel  Result Value Ref Range   Sodium 136 135 - 145 mmol/L   Potassium 4.6 3.5 - 5.1 mmol/L   Chloride 104 98 - 111 mmol/L   CO2 25 22 - 32 mmol/L   Glucose, Bld 165 (H) 70 - 99 mg/dL   BUN 10 6 - 20 mg/dL   Creatinine, Ser 0.92 0.61 - 1.24 mg/dL   Calcium 9.3 8.9 - 10.3 mg/dL   Total Protein 7.0 6.5 - 8.1 g/dL   Albumin 3.6 3.5 - 5.0 g/dL   AST 19 15 - 41 U/L   ALT 21 0 - 44 U/L   Alkaline Phosphatase 64 38 - 126 U/L   Total Bilirubin 0.8 0.3 - 1.2 mg/dL   GFR, Estimated >60 >60 mL/min   Anion gap 7 5 - 15  CBC  Result Value Ref Range   WBC 5.8 4.0 - 10.5 K/uL   RBC 5.12 4.22 - 5.81 MIL/uL   Hemoglobin 15.9 13.0  - 17.0 g/dL   HCT 47.3 39.0 - 52.0 %   MCV 92.4 80.0 - 100.0 fL  MCH 31.1 26.0 - 34.0 pg   MCHC 33.6 30.0 - 36.0 g/dL   RDW 14.2 11.5 - 15.5 %   Platelets 255 150 - 400 K/uL   nRBC 0.0 0.0 - 0.2 %  Troponin I (High Sensitivity)  Result Value Ref Range   Troponin I (High Sensitivity) 8 <18 ng/L   CT Angio Chest/Abd/Pel for Dissection W and/or W/WO  Result Date: 11/23/2021 CLINICAL DATA:  Chest pain, back pain EXAM: CT ANGIOGRAPHY CHEST, ABDOMEN AND PELVIS TECHNIQUE: Non-contrast CT of the chest was initially obtained. Multidetector CT imaging through the chest, abdomen and pelvis was performed using the standard protocol during bolus administration of intravenous contrast. Multiplanar reconstructed images and MIPs were obtained and reviewed to evaluate the vascular anatomy. RADIATION DOSE REDUCTION: This exam was performed according to the departmental dose-optimization program which includes automated exposure control, adjustment of the mA and/or kV according to patient size and/or use of iterative reconstruction technique. CONTRAST:  171m OMNIPAQUE IOHEXOL 350 MG/ML SOLN COMPARISON:  CT abdomen and pelvis done on 01/17/2011 FINDINGS: CTA CHEST FINDINGS Cardiovascular: There is no demonstrable mural hematoma in thoracic aorta in the noncontrast images. There is homogeneous enhancement in thoracic aorta. There is no demonstrable intimal flap. Major branches of thoracic aorta appear patent. There are no intraluminal filling defects in pulmonary artery branches. Mediastinum/Nodes: Unremarkable. Lungs/Pleura: There is no focal pulmonary consolidation. Multiple blebs and bullae are seen in left lower lung field. There is no pleural effusion or pneumothorax. Musculoskeletal: No acute findings are seen. There is encroachment of neural foramina in visualized lower cervical spine by bony spurs. Review of the MIP images confirms the above findings. CTA ABDOMEN AND PELVIS FINDINGS VASCULAR Aorta: Scattered  atherosclerotic plaques and calcifications are seen. There is no evidence of dissection or focal aneurysmal dilation. Celiac: Unremarkable. SMA: Unremarkable. Renals: Unremarkable. IMA: Patent. Inflow: Unremarkable. Veins: Unremarkable. Review of the MIP images confirms the above findings. NON-VASCULAR Hepatobiliary: There is 3 cm low-density in the right lobe of liver, possibly benign process such as hemangioma. 1.8 cm low-density lesion seen in the same region in the study done on 01/17/2011. There is no dilation of bile ducts. Gallbladder is unremarkable. Pancreas: No focal abnormalities are seen. Spleen: Unremarkable. Adrenals/Urinary Tract: Adrenals are unremarkable. There is no hydronephrosis. There are no renal or ureteral stones. There are possible small renal cysts. Urinary bladder is unremarkable. Stomach/Bowel: Stomach is unremarkable. Small bowel loops are not dilated. Appendix is not dilated. There is no significant wall thickening and colon. There is no pericolic stranding. Lymphatic: Unremarkable. Reproductive: Unremarkable. Other: There is no ascites or pneumoperitoneum. Right inguinal hernia containing fat is seen. Small umbilical hernia containing fat is seen. Musculoskeletal: No acute findings are seen. Review of the MIP images confirms the above findings. IMPRESSION: There is no evidence of dissection in the thoracic and abdominal aorta. Major branches of thoracic and abdominal aorta are patent. There is no evidence of pulmonary artery embolism. There is no focal pulmonary consolidation. There is no evidence of intestinal obstruction or pneumoperitoneum. There is no hydronephrosis. There is 3 cm low-density structure in the right lobe of liver, most likely a benign process such as hemangioma. Possible renal cysts. Cervical spondylosis. Other findings as described in the body of the report. Electronically Signed   By: PElmer PickerM.D.   On: 11/23/2021 15:48   DG Chest Port 1  View  Result Date: 11/23/2021 CLINICAL DATA:  Chest pain, diaphoresis EXAM: PORTABLE CHEST 1 VIEW COMPARISON:  11/20/2021 FINDINGS:  The heart size and mediastinal contours are within normal limits. Both lungs are clear. The visualized skeletal structures are unremarkable. IMPRESSION: No active disease. Electronically Signed   By: Elmer Picker M.D.   On: 11/23/2021 14:14   DG Chest Port 1 View  Result Date: 11/20/2021 CLINICAL DATA:  Chest pain. EXAM: PORTABLE CHEST 1 VIEW COMPARISON:  Chest radiograph dated 08/24/2020. FINDINGS: No focal consolidation, pleural effusion, or pneumothorax. The cardiac silhouette is within normal limits. No acute osseous pathology. IMPRESSION: No active disease. Electronically Signed   By: Anner Crete M.D.   On: 11/20/2021 20:09     EKG EKG Interpretation  Date/Time:  Wednesday November 23 2021 13:51:29 EDT Ventricular Rate:  70 PR Interval:  136 QRS Duration: 89 QT Interval:  397 QTC Calculation: 429 R Axis:   -5 Text Interpretation: Sinus rhythm Non-specific ST-t changes `no acute change from prior ecg Confirmed by Lajean Saver [phone]) on 11/23/2021 2:07:13 PM  Radiology CT Angio Chest/Abd/Pel for Dissection W and/or W/WO  Result Date: 11/23/2021 CLINICAL DATA:  Chest pain, back pain EXAM: CT ANGIOGRAPHY CHEST, ABDOMEN AND PELVIS TECHNIQUE: Non-contrast CT of the chest was initially obtained. Multidetector CT imaging through the chest, abdomen and pelvis was performed using the standard protocol during bolus administration of intravenous contrast. Multiplanar reconstructed images and MIPs were obtained and reviewed to evaluate the vascular anatomy. RADIATION DOSE REDUCTION: This exam was performed according to the departmental dose-optimization program which includes automated exposure control, adjustment of the mA and/or kV according to patient size and/or use of iterative reconstruction technique. CONTRAST:  176m OMNIPAQUE IOHEXOL 350 MG/ML SOLN  COMPARISON:  CT abdomen and pelvis done on 01/17/2011 FINDINGS: CTA CHEST FINDINGS Cardiovascular: There is no demonstrable mural hematoma in thoracic aorta in the noncontrast images. There is homogeneous enhancement in thoracic aorta. There is no demonstrable intimal flap. Major branches of thoracic aorta appear patent. There are no intraluminal filling defects in pulmonary artery branches. Mediastinum/Nodes: Unremarkable. Lungs/Pleura: There is no focal pulmonary consolidation. Multiple blebs and bullae are seen in left lower lung field. There is no pleural effusion or pneumothorax. Musculoskeletal: No acute findings are seen. There is encroachment of neural foramina in visualized lower cervical spine by bony spurs. Review of the MIP images confirms the above findings. CTA ABDOMEN AND PELVIS FINDINGS VASCULAR Aorta: Scattered atherosclerotic plaques and calcifications are seen. There is no evidence of dissection or focal aneurysmal dilation. Celiac: Unremarkable. SMA: Unremarkable. Renals: Unremarkable. IMA: Patent. Inflow: Unremarkable. Veins: Unremarkable. Review of the MIP images confirms the above findings. NON-VASCULAR Hepatobiliary: There is 3 cm low-density in the right lobe of liver, possibly benign process such as hemangioma. 1.8 cm low-density lesion seen in the same region in the study done on 01/17/2011. There is no dilation of bile ducts. Gallbladder is unremarkable. Pancreas: No focal abnormalities are seen. Spleen: Unremarkable. Adrenals/Urinary Tract: Adrenals are unremarkable. There is no hydronephrosis. There are no renal or ureteral stones. There are possible small renal cysts. Urinary bladder is unremarkable. Stomach/Bowel: Stomach is unremarkable. Small bowel loops are not dilated. Appendix is not dilated. There is no significant wall thickening and colon. There is no pericolic stranding. Lymphatic: Unremarkable. Reproductive: Unremarkable. Other: There is no ascites or pneumoperitoneum. Right  inguinal hernia containing fat is seen. Small umbilical hernia containing fat is seen. Musculoskeletal: No acute findings are seen. Review of the MIP images confirms the above findings. IMPRESSION: There is no evidence of dissection in the thoracic and abdominal aorta. Major branches of thoracic and  abdominal aorta are patent. There is no evidence of pulmonary artery embolism. There is no focal pulmonary consolidation. There is no evidence of intestinal obstruction or pneumoperitoneum. There is no hydronephrosis. There is 3 cm low-density structure in the right lobe of liver, most likely a benign process such as hemangioma. Possible renal cysts. Cervical spondylosis. Other findings as described in the body of the report. Electronically Signed   By: Elmer Picker M.D.   On: 11/23/2021 15:48   DG Chest Port 1 View  Result Date: 11/23/2021 CLINICAL DATA:  Chest pain, diaphoresis EXAM: PORTABLE CHEST 1 VIEW COMPARISON:  11/20/2021 FINDINGS: The heart size and mediastinal contours are within normal limits. Both lungs are clear. The visualized skeletal structures are unremarkable. IMPRESSION: No active disease. Electronically Signed   By: Elmer Picker M.D.   On: 11/23/2021 14:14    Procedures Procedures    Medications Ordered in ED Medications  iohexol (OMNIPAQUE) 350 MG/ML injection 100 mL (100 mLs Intravenous Contrast Given 11/23/21 1521)    ED Course/ Medical Decision Making/ A&P                           Medical Decision Making Problems Addressed: Essential hypertension: chronic illness or injury with exacerbation, progression, or side effects of treatment that poses a threat to life or bodily functions Precordial chest pain: acute illness or injury with systemic symptoms that poses a threat to life or bodily functions  Amount and/or Complexity of Data Reviewed Independent Historian: EMS    Details: hx External Data Reviewed: notes. Labs: ordered. Decision-making details  documented in ED Course. Radiology: ordered and independent interpretation performed. Decision-making details documented in ED Course. ECG/medicine tests: ordered and independent interpretation performed. Decision-making details documented in ED Course. Discussion of management or test interpretation with external provider(s): cardiology  Risk Prescription drug management. Decision regarding hospitalization.  Iv ns. Continuous pulse ox and cardiac monitoring. Labs ordered/sent. Imaging ordered.   Diff dx includes acs, msk, cp, dissection, pna, htn, etc - dispo decision including potential need for admission considered if elev trop, ct +, etc - will get labs and imaging and reassess.   Reviewed nursing notes and prior charts for additional history. External reports reviewed.  Cardiology evaluated pt/ecg on arrival to ED - indicates cancel code stemi and do normal ed eval. Additional history from: EMS.   Cardiac monitor: sinus rhythm, rate 70.   Labs reviewed/interpreted by me - initial trop normal.   Xrays reviewed/interpreted by me - no pna.   CT reviewed/interpreted by me - no dissection.   Recheck pt, no cp, breathing comfortably. Bp 141/70.   1559, signed out to Dr Zenia Resides to check delta trop - if normal/not increasing and symptoms resolved, feel likely will be stable for d/c, w close pcp and outpatient cardiology f/u.           Final Clinical Impression(s) / ED Diagnoses Final diagnoses:  None    Rx / DC Orders ED Discharge Orders     None         Lajean Saver, MD 11/23/21 [phone]

## 2021-11-23 NOTE — ED Notes (Signed)
Patient transported to CT 

## 2022-03-04 IMAGING — DX DG HUMERUS 2V *R*
2 series · 2 of 2 positions shown · non-contrast
Comparison: None.

CLINICAL DATA: Right arm pain

EXAM:
RIGHT HUMERUS - 2+ VIEW

[humerus ap]
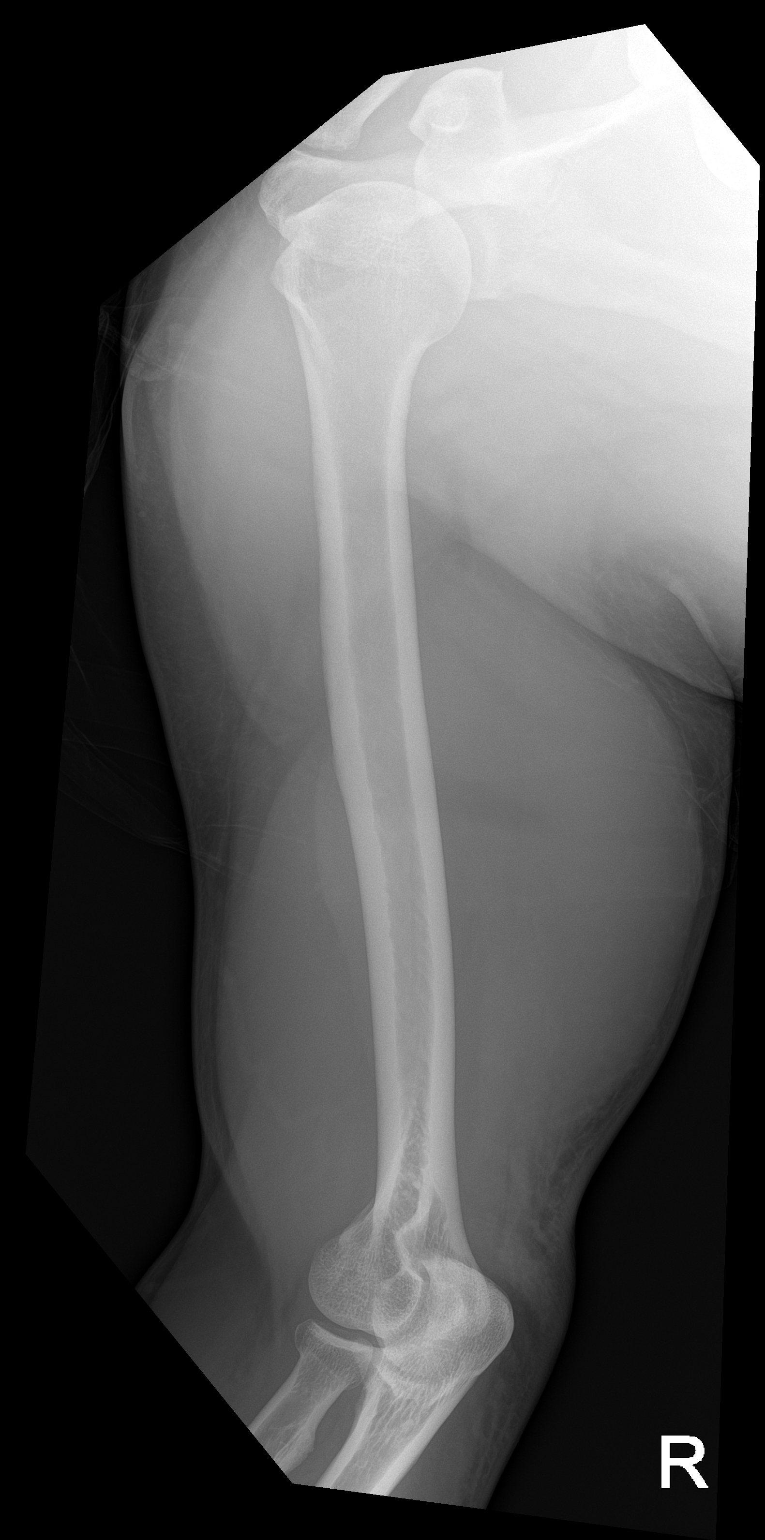

[humerus lat]
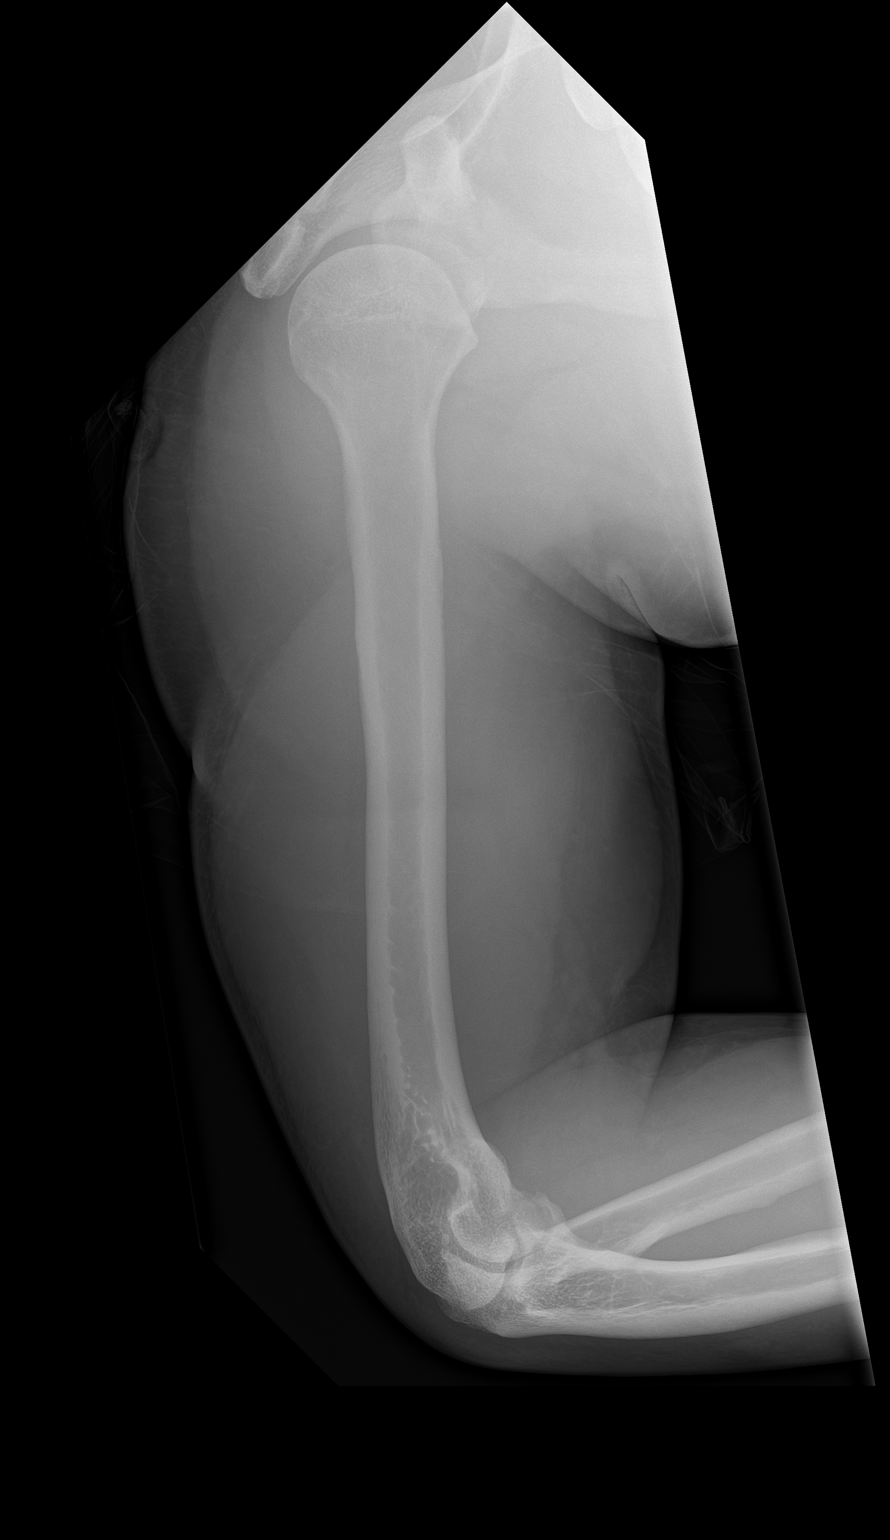

[2 of 2 positions shown; findings below may reference images not displayed]

FINDINGS: There is no evidence of fracture or other focal bone lesions. Soft
tissues are unremarkable.
IMPRESSION: Negative.

## 2022-08-23 IMAGING — DX DG CHEST 1V PORT
2 series · 2 of 2 positions shown · non-contrast
Comparison: 07/08/2020

CLINICAL DATA: Chest pain.  Chest tightness.

EXAM:
PORTABLE CHEST 1 VIEW

[chest ap (1 of 2)]
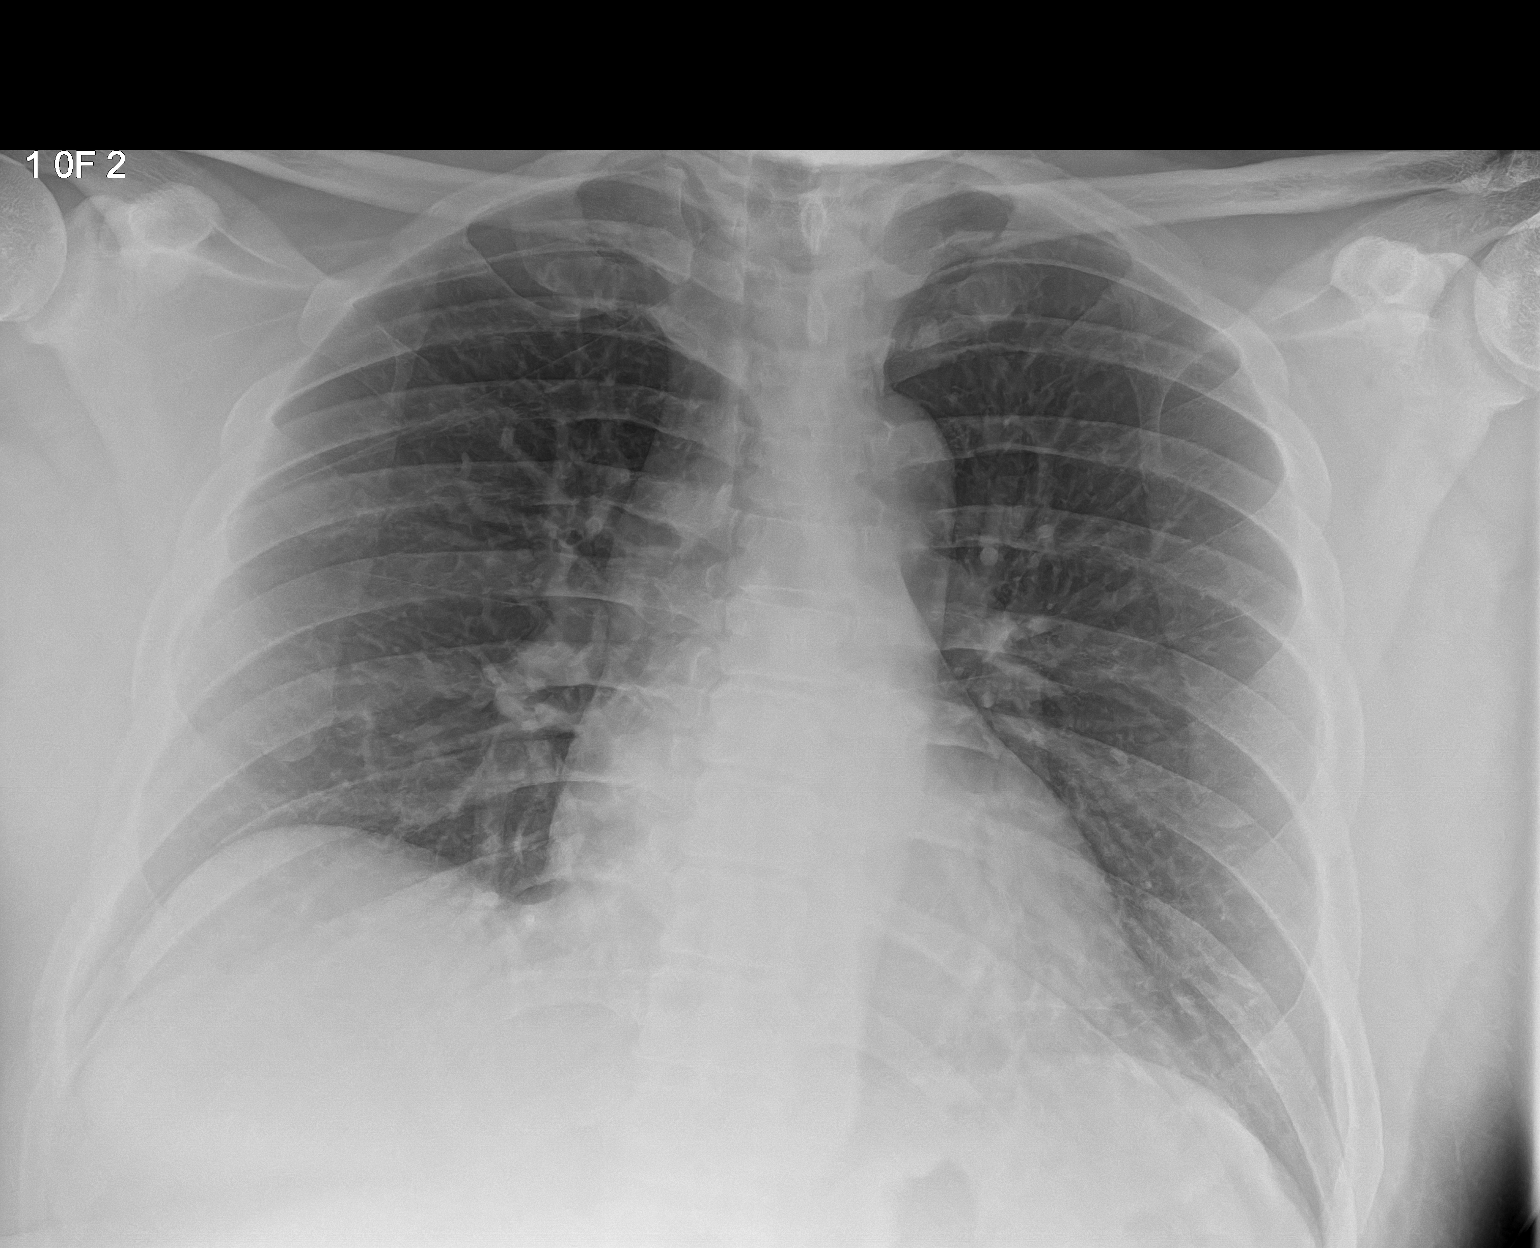

[chest ap (2 of 2)]
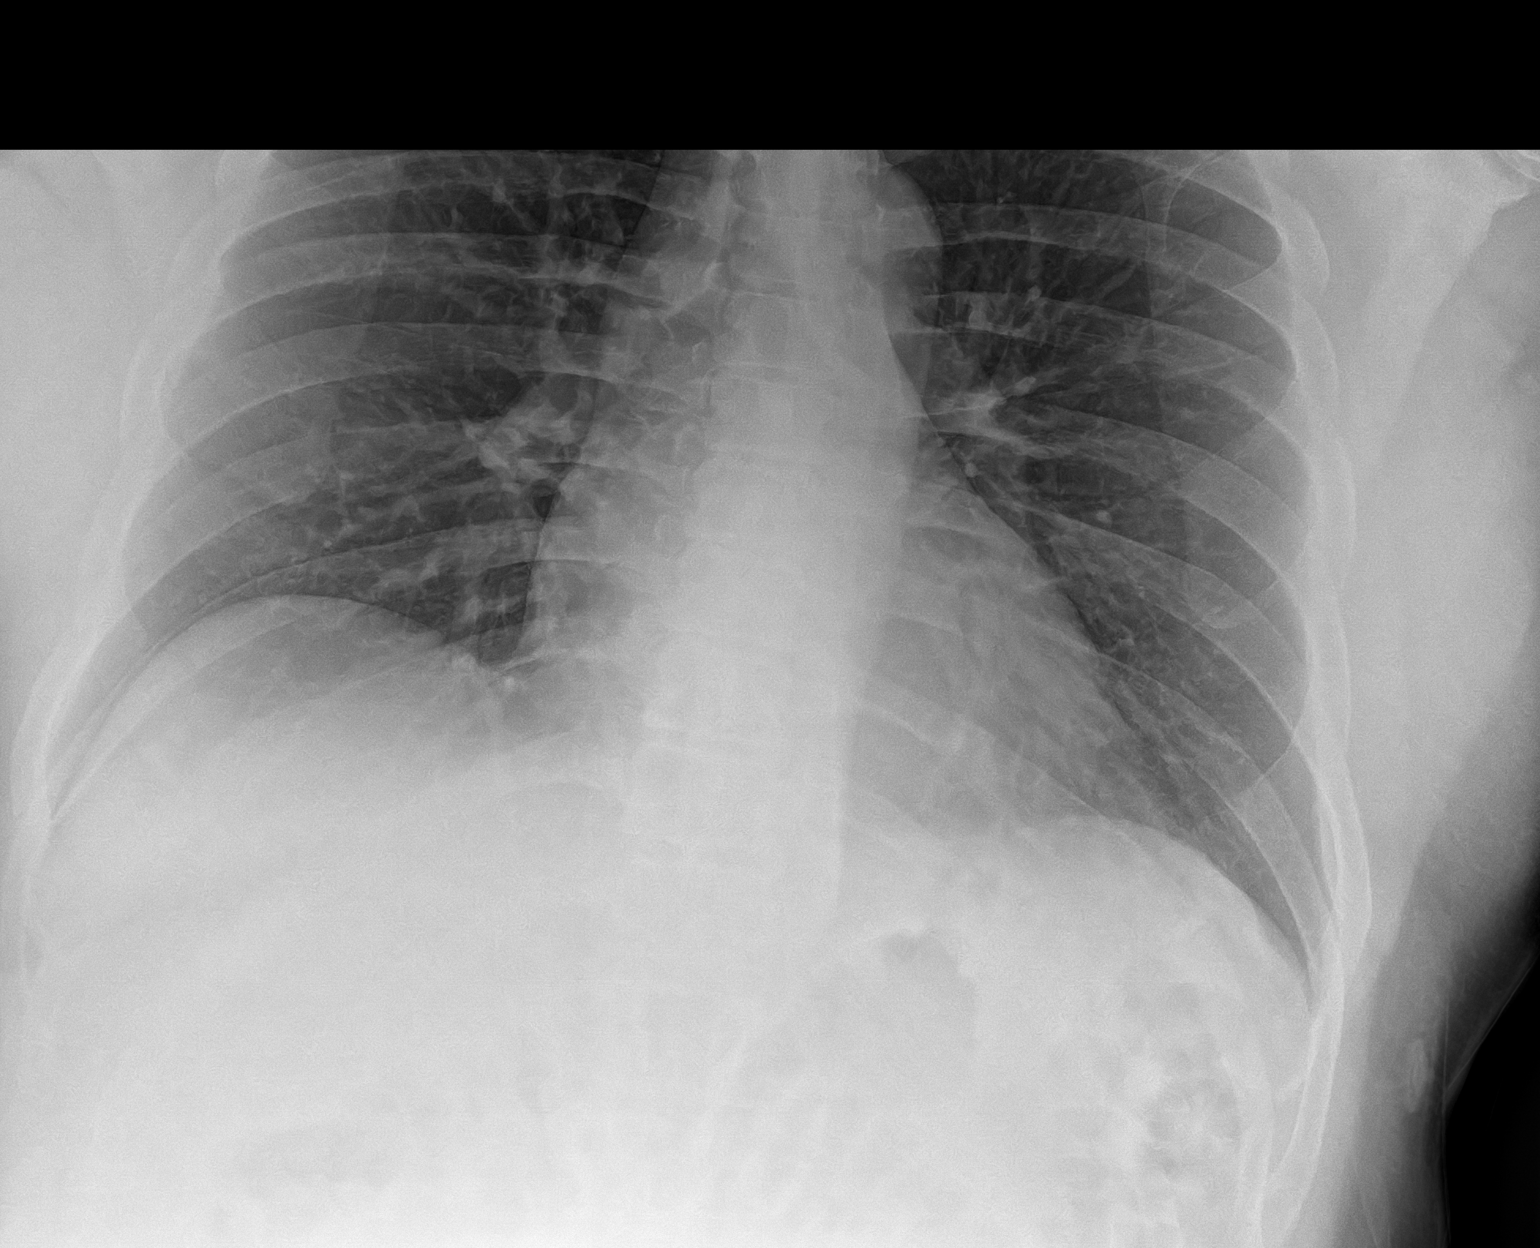

[2 of 2 positions shown; findings below may reference images not displayed]

FINDINGS: Mild chronic elevation of right hemidiaphragm.The cardiomediastinal
contours are normal. The lungs are clear. Pulmonary vasculature is
normal. No consolidation, pleural effusion, or pneumothorax. No
acute osseous abnormalities are seen.
IMPRESSION: No acute chest findings.

## 2022-11-08 ENCOUNTER — Emergency Department (HOSPITAL_COMMUNITY): Payer: BC Managed Care – PPO

## 2022-11-08 ENCOUNTER — Emergency Department (HOSPITAL_COMMUNITY)
Admission: EM | Admit: 2022-11-08 | Discharge: 2022-11-08 | Payer: BC Managed Care – PPO | Attending: Emergency Medicine | Admitting: Emergency Medicine

## 2022-11-08 ENCOUNTER — Other Ambulatory Visit: Payer: Self-pay

## 2022-11-08 DIAGNOSIS — I2510 Atherosclerotic heart disease of native coronary artery without angina pectoris: Secondary | ICD-10-CM | POA: Diagnosis not present

## 2022-11-08 DIAGNOSIS — Z79899 Other long term (current) drug therapy: Secondary | ICD-10-CM | POA: Insufficient documentation

## 2022-11-08 DIAGNOSIS — R079 Chest pain, unspecified: Secondary | ICD-10-CM

## 2022-11-08 DIAGNOSIS — I509 Heart failure, unspecified: Secondary | ICD-10-CM | POA: Insufficient documentation

## 2022-11-08 DIAGNOSIS — R200 Anesthesia of skin: Secondary | ICD-10-CM | POA: Insufficient documentation

## 2022-11-08 DIAGNOSIS — R0789 Other chest pain: Secondary | ICD-10-CM | POA: Diagnosis present

## 2022-11-08 DIAGNOSIS — E119 Type 2 diabetes mellitus without complications: Secondary | ICD-10-CM | POA: Diagnosis not present

## 2022-11-08 DIAGNOSIS — I110 Hypertensive heart disease with heart failure: Secondary | ICD-10-CM | POA: Diagnosis not present

## 2022-11-08 DIAGNOSIS — J449 Chronic obstructive pulmonary disease, unspecified: Secondary | ICD-10-CM | POA: Insufficient documentation

## 2022-11-08 DIAGNOSIS — R002 Palpitations: Secondary | ICD-10-CM | POA: Diagnosis not present

## 2022-11-08 LAB — CBC
HCT: 47.3 % (ref 39.0–52.0)
Hemoglobin: 15.7 g/dL (ref 13.0–17.0)
MCH: 30.5 pg (ref 26.0–34.0)
MCHC: 33.2 g/dL (ref 30.0–36.0)
MCV: 91.8 fL (ref 80.0–100.0)
Platelets: 239 10*3/uL (ref 150–400)
RBC: 5.15 MIL/uL (ref 4.22–5.81)
RDW: 14.5 % (ref 11.5–15.5)
WBC: 5.6 10*3/uL (ref 4.0–10.5)
nRBC: 0 % (ref 0.0–0.2)

## 2022-11-08 LAB — HEPATIC FUNCTION PANEL
ALT: 14 U/L (ref 0–44)
AST: 16 U/L (ref 15–41)
Albumin: 3.7 g/dL (ref 3.5–5.0)
Alkaline Phosphatase: 60 U/L (ref 38–126)
Bilirubin, Direct: 0.1 mg/dL (ref 0.0–0.2)
Indirect Bilirubin: 0.5 mg/dL (ref 0.3–0.9)
Total Bilirubin: 0.6 mg/dL (ref 0.3–1.2)
Total Protein: 7.3 g/dL (ref 6.5–8.1)

## 2022-11-08 LAB — BASIC METABOLIC PANEL
Anion gap: 11 (ref 5–15)
BUN: 11 mg/dL (ref 6–20)
CO2: 23 mmol/L (ref 22–32)
Calcium: 9 mg/dL (ref 8.9–10.3)
Chloride: 100 mmol/L (ref 98–111)
Creatinine, Ser: 0.89 mg/dL (ref 0.61–1.24)
GFR, Estimated: >60 mL/min (ref >60)
Glucose, Bld: 133 mg/dL — ABNORMAL HIGH (ref 70–99)
Potassium: 3.9 mmol/L (ref 3.5–5.1)
Sodium: 134 mmol/L — ABNORMAL LOW (ref 135–145)

## 2022-11-08 LAB — MAGNESIUM: Magnesium: 1.8 mg/dL (ref 1.7–2.4)

## 2022-11-08 LAB — TROPONIN I (HIGH SENSITIVITY)
Troponin I (High Sensitivity): 12 ng/L (ref <18)
Troponin I (High Sensitivity): 10 ng/L (ref <18)

## 2022-11-08 LAB — LIPASE, BLOOD: Lipase: 26 U/L (ref 11–51)

## 2022-11-08 MED ORDER — ATORVASTATIN CALCIUM 10 MG PO TABS: 10.0000 mg | ORAL_TABLET | Freq: Every day | ORAL | 0 refills | Status: DC

## 2022-11-08 MED ORDER — METOPROLOL TARTRATE 25 MG PO TABS
25.0000 mg | ORAL_TABLET | Freq: Two times a day (BID) | ORAL | Status: DC
  Administered 2022-11-08: 25 mg via ORAL
  Filled 2022-11-08: qty 1

## 2022-11-08 MED ORDER — NITROGLYCERIN 2 % TD OINT
1.0000 [in_us] | TOPICAL_OINTMENT | Freq: Once | TRANSDERMAL | Status: AC
  Administered 2022-11-08: 1 [in_us] via TOPICAL
  Filled 2022-11-08: qty 1

## 2022-11-08 MED ORDER — AMLODIPINE BESYLATE 5 MG PO TABS
10.0000 mg | ORAL_TABLET | Freq: Every day | ORAL | Status: DC
  Administered 2022-11-08: 10 mg via ORAL
  Filled 2022-11-08: qty 2

## 2022-11-08 MED ORDER — ASPIRIN 81 MG PO CHEW
324.0000 mg | CHEWABLE_TABLET | Freq: Once | ORAL | Status: AC
  Administered 2022-11-08: 324 mg via ORAL
  Filled 2022-11-08: qty 4

## 2022-11-08 MED ORDER — AMLODIPINE BESYLATE 10 MG PO TABS: 10.0000 mg | ORAL_TABLET | Freq: Every day | ORAL | 0 refills | Status: DC

## 2022-11-08 MED ORDER — LISINOPRIL 20 MG PO TABS: 20.0000 mg | ORAL_TABLET | Freq: Every day | ORAL | 0 refills | Status: DC

## 2022-11-08 MED ORDER — ASPIRIN 81 MG PO CHEW
CHEWABLE_TABLET | ORAL | Status: AC
  Filled 2022-11-08: qty 1

## 2022-11-08 MED ORDER — IOHEXOL 350 MG/ML SOLN
100.0000 mL | Freq: Once | INTRAVENOUS | Status: AC | PRN
  Administered 2022-11-08: 100 mL via INTRAVENOUS

## 2022-11-08 MED ORDER — FENTANYL CITRATE PF 50 MCG/ML IJ SOSY
50.0000 ug | PREFILLED_SYRINGE | Freq: Once | INTRAMUSCULAR | Status: AC
  Administered 2022-11-08: 50 ug via INTRAVENOUS
  Filled 2022-11-08: qty 1

## 2022-11-08 MED ORDER — PANTOPRAZOLE SODIUM 40 MG PO TBEC: 40.0000 mg | DELAYED_RELEASE_TABLET | Freq: Every day | ORAL | 1 refills | Status: AC

## 2022-11-08 MED ORDER — METOPROLOL TARTRATE 25 MG PO TABS: 25.0000 mg | ORAL_TABLET | Freq: Two times a day (BID) | ORAL | 0 refills | Status: DC

## 2022-11-08 NOTE — ED Triage Notes (Addendum)
Pt from [facility] c/o chest pain that radiates down left arm and palpataions x6 hours. Hx previous MI, hypertension and diabetes. Pt has not taken bp medications since 7/27

## 2022-11-08 NOTE — Discharge Instructions (Signed)
Test results today in the emergency department are reassuring.  You should follow-up with cardiology to reestablish care.  Prescriptions for home medications are attached.  Take daily as prescribed.  Return to the emergency department for any new or worsening symptoms of concern.
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
## 2023-07-16 ENCOUNTER — Encounter (HOSPITAL_COMMUNITY): Payer: Self-pay

## 2023-07-16 ENCOUNTER — Other Ambulatory Visit: Payer: Self-pay

## 2023-07-16 ENCOUNTER — Emergency Department (HOSPITAL_COMMUNITY)
Admission: EM | Admit: 2023-07-16 | Discharge: 2023-07-16 | Disposition: A | Discharge status: Home / Self Care | Payer: Self-pay | Attending: Emergency Medicine | Admitting: Emergency Medicine

## 2023-07-16 DIAGNOSIS — Z79899 Other long term (current) drug therapy: Secondary | ICD-10-CM | POA: Insufficient documentation

## 2023-07-16 DIAGNOSIS — I509 Heart failure, unspecified: Secondary | ICD-10-CM | POA: Insufficient documentation

## 2023-07-16 DIAGNOSIS — J449 Chronic obstructive pulmonary disease, unspecified: Secondary | ICD-10-CM | POA: Insufficient documentation

## 2023-07-16 DIAGNOSIS — L03311 Cellulitis of abdominal wall: Secondary | ICD-10-CM | POA: Insufficient documentation

## 2023-07-16 DIAGNOSIS — Z7984 Long term (current) use of oral hypoglycemic drugs: Secondary | ICD-10-CM | POA: Insufficient documentation

## 2023-07-16 DIAGNOSIS — I110 Hypertensive heart disease with heart failure: Secondary | ICD-10-CM | POA: Insufficient documentation

## 2023-07-16 DIAGNOSIS — E119 Type 2 diabetes mellitus without complications: Secondary | ICD-10-CM | POA: Insufficient documentation

## 2023-07-16 LAB — CBG MONITORING, ED: Glucose-Capillary: 410 mg/dL — ABNORMAL HIGH (ref 70–99)

## 2023-07-16 MED ORDER — CEPHALEXIN 500 MG PO CAPS
500.0000 mg | ORAL_CAPSULE | Freq: Once | ORAL | Status: AC
  Administered 2023-07-16: 500 mg via ORAL
  Filled 2023-07-16: qty 1

## 2023-07-16 MED ORDER — SODIUM CHLORIDE 0.9 % IV SOLN: 1.0000 g | Freq: Once | INTRAVENOUS | Status: DC

## 2023-07-16 MED ORDER — CEPHALEXIN 500 MG PO CAPS: 500.0000 mg | ORAL_CAPSULE | Freq: Four times a day (QID) | ORAL | 0 refills | Status: AC

## 2023-07-16 MED ORDER — LACTATED RINGERS IV BOLUS: 2000.0000 mL | Freq: Once | INTRAVENOUS | Status: DC

## 2023-07-16 NOTE — ED Provider Notes (Signed)
 [place] EMERGENCY DEPARTMENT AT [facility] Provider Note   CSN: [id number] Arrival date & time: 07/16/23  0002     History  Chief Complaint  Patient presents with   Abscess    Miguel Elliott. is a 48 y.o. male.   Abscess Associated symptoms: nausea and vomiting   Patient presents for area of skin change on abdomen.  Medical history includes DM, GERD, HTN, COPD, CHF, depression, anxiety, polysubstance abuse.  Earlier in the day, patient noticed a small area of swelling on right mid abdomen.  He denies any associated pain.  He states that there was some scant amount of clear discharge from the area.  He had some nausea and vomiting earlier in the day which has resolved.  He denies any other recent symptoms.     Home Medications Prior to Admission medications   Medication Sig Start Date End Date Taking? Authorizing Provider  cephALEXin (KEFLEX) 500 MG capsule Take 1 capsule (500 mg total) by mouth 4 (four) times daily. 07/16/23  Yes Gloris Manchester, MD  amLODipine (NORVASC) 10 MG tablet Take 1 tablet (10 mg total) by mouth daily. 11/08/22   Gloris Manchester, MD  atorvastatin (LIPITOR) 10 MG tablet Take 1 tablet (10 mg total) by mouth daily at 6 PM. 11/08/22   Gloris Manchester, MD  blood glucose meter kit and supplies Dispense based on patient and insurance preference. Use up to four times daily as directed. (FOR ICD-10 E10.9, E11.9). 05/24/19   Johnson, Clanford L, MD  clonazePAM (KLONOPIN) 0.5 MG tablet Take 1 tablet (0.5 mg total) by mouth 2 (two) times daily as needed for anxiety. 07/08/20   Bethann Berkshire, MD  lisinopril (ZESTRIL) 20 MG tablet Take 1 tablet (20 mg total) by mouth daily. 11/08/22   Gloris Manchester, MD  metFORMIN (GLUCOPHAGE XR) 500 MG 24 hr tablet Take 1 tablet (500 mg total) by mouth 2 (two) times daily with a meal. 05/24/19 01/17/20  Johnson, Clanford L, MD  metoprolol tartrate (LOPRESSOR) 25 MG tablet Take 1 tablet (25 mg total) by mouth 2 (two) times daily. 11/08/22 12/08/22   Gloris Manchester, MD  pantoprazole (PROTONIX) 40 MG tablet Take 1 tablet (40 mg total) by mouth daily. 11/08/22   Gloris Manchester, MD  spironolactone (ALDACTONE) 50 MG tablet Take 50 mg by mouth daily. 11/18/19   [provider]      Allergies    Patient has no known allergies.    Review of Systems   Review of Systems  Gastrointestinal:  Positive for nausea and vomiting.  Skin:  Positive for color change.  All other systems reviewed and are negative.   Physical Exam Updated Vital Signs BP (!) 160/101   Pulse 98   Temp 97.6 F (36.4 C)   Resp 18   Ht 5\' 9"  (1.753 m)   Wt 112.9 kg   SpO2 96%   BMI 36.76 kg/m  Physical Exam Vitals and nursing note reviewed.  Constitutional:      General: He is not in acute distress.    Appearance: Normal appearance. He is well-developed. He is not ill-appearing, toxic-appearing or diaphoretic.  HENT:     Head: Normocephalic and atraumatic.     Right Ear: External ear normal.     Left Ear: External ear normal.     Nose: Nose normal.     Mouth/Throat:     Mouth: Mucous membranes are moist.  Eyes:     Extraocular Movements: Extraocular movements intact.  Conjunctiva/sclera: Conjunctivae normal.  Cardiovascular:     Rate and Rhythm: Regular rhythm. Tachycardia present.  Pulmonary:     Effort: Pulmonary effort is normal. No respiratory distress.  Abdominal:     General: There is no distension.     Palpations: Abdomen is soft.     Tenderness: There is no abdominal tenderness.  Musculoskeletal:        General: No swelling or deformity. Normal range of motion.     Cervical back: Normal range of motion and neck supple.  Skin:    General: Skin is warm and dry.     Findings: Erythema present.  Neurological:     General: No focal deficit present.     Mental Status: He is alert and oriented to person, place, and time.  Psychiatric:        Mood and Affect: Mood normal.        Behavior: Behavior normal.     ED Results / Procedures /  Treatments   Labs (all labs ordered are listed, but only abnormal results are displayed) Labs Reviewed  CBG MONITORING, ED - Abnormal; Notable for the following components:      Result Value   Glucose-Capillary 410 (*)    All other components within normal limits    EKG None  Radiology No results found.  Procedures Procedures    Medications Ordered in ED Medications  cephALEXin (KEFLEX) capsule 500 mg (500 mg Oral Given 07/16/23 0151)    ED Course/ Medical Decision Making/ A&P                                 Medical Decision Making Risk Prescription drug management.   Patient presenting for area of skin change on abdomen.  This was first noticed when he woke up.  He suspects he may have been bitten by an insect.  He denies any associated pain to the area.  On exam, there is a central area of induration surrounded by an area of erythema and warmth.  On bedside ultrasound, there is no underlying fluid collection.  Patient to be treated empirically for cellulitis.  He was initially noted to be tachycardic in the ED.  On check of CBG, blood sugar was 410.  Lab work and IV fluids were ordered, however, patient declined to this.  He states that his blood sugar is elevated because he has just eaten fast food and soda.  He request antibiotic and discharge only.  Patient was prescribed Keflex and given his first dose in the ED.  He was discharged in stable condition.        Final Clinical Impression(s) / ED Diagnoses Final diagnoses:  Cellulitis of abdominal wall    Rx / DC Orders ED Discharge Orders          Ordered    cephALEXin (KEFLEX) 500 MG capsule  4 times daily        07/16/23 0202              Iva Mariner, MD 07/16/23 [phone]

## 2023-07-16 NOTE — ED Notes (Addendum)
 Pt refused all tests and tx other than anbx, says that is all he wants. Reports his sugar is high b/c he just ate cookout and hasn't taken his diabetic medication yet, but is going to take it as soon as he gets home. Dr Kermit Ped aware.

## 2023-07-16 NOTE — Discharge Instructions (Signed)
 A prescription for an antibiotic was sent to your pharmacy.  This is for treatment of a skin infection.  Take as prescribed for the next 5 days.  Return to the emergency department for any new or worsening symptoms of concern.

## 2023-07-16 NOTE — ED Triage Notes (Signed)
 Pt to ED from home with c/o abscess to abd that he noticed yesterday, area has hard knot with head, no drainage at this time.

## 2023-07-16 NOTE — ED Notes (Signed)
 Pt refused EKG, stated he is just looking for antibiotics and discharge papers.

## 2023-07-16 NOTE — ED Notes (Signed)
 RN to room to discharge pt and go over DC instructions/Rx- pt not in room -left without paperwork

## 2024-01-18 ENCOUNTER — Other Ambulatory Visit: Payer: Self-pay

## 2024-01-18 ENCOUNTER — Emergency Department (HOSPITAL_COMMUNITY): Payer: Self-pay

## 2024-01-18 ENCOUNTER — Emergency Department (HOSPITAL_COMMUNITY)
Admission: EM | Admit: 2024-01-18 | Discharge: 2024-01-18 | Disposition: A | Discharge status: Home / Self Care | Payer: Self-pay | Attending: Emergency Medicine | Admitting: Emergency Medicine

## 2024-01-18 ENCOUNTER — Encounter (HOSPITAL_COMMUNITY): Payer: Self-pay | Admitting: Emergency Medicine

## 2024-01-18 DIAGNOSIS — Z7984 Long term (current) use of oral hypoglycemic drugs: Secondary | ICD-10-CM | POA: Diagnosis not present

## 2024-01-18 DIAGNOSIS — Z79899 Other long term (current) drug therapy: Secondary | ICD-10-CM | POA: Insufficient documentation

## 2024-01-18 DIAGNOSIS — I10 Essential (primary) hypertension: Secondary | ICD-10-CM | POA: Insufficient documentation

## 2024-01-18 DIAGNOSIS — E119 Type 2 diabetes mellitus without complications: Secondary | ICD-10-CM | POA: Insufficient documentation

## 2024-01-18 DIAGNOSIS — R079 Chest pain, unspecified: Secondary | ICD-10-CM | POA: Diagnosis present

## 2024-01-18 LAB — CBC WITH DIFFERENTIAL/PLATELET
Abs Immature Granulocytes: 0.01 K/uL (ref 0.00–0.07)
Basophils Absolute: 0 K/uL (ref 0.0–0.1)
Basophils Relative: 0 %
Eosinophils Absolute: 0 K/uL (ref 0.0–0.5)
Eosinophils Relative: 1 %
HCT: 47.3 % (ref 39.0–52.0)
Hemoglobin: 15.6 g/dL (ref 13.0–17.0)
Immature Granulocytes: 0 %
Lymphocytes Relative: 28 %
Lymphs Abs: 1.6 K/uL (ref 0.7–4.0)
MCH: 30.7 pg (ref 26.0–34.0)
MCHC: 33 g/dL (ref 30.0–36.0)
MCV: 93.1 fL (ref 80.0–100.0)
Monocytes Absolute: 0.4 K/uL (ref 0.1–1.0)
Monocytes Relative: 8 %
Neutro Abs: 3.6 K/uL (ref 1.7–7.7)
Neutrophils Relative %: 63 %
Platelets: 223 K/uL (ref 150–400)
RBC: 5.08 MIL/uL (ref 4.22–5.81)
RDW: 13.2 % (ref 11.5–15.5)
WBC: 5.6 K/uL (ref 4.0–10.5)
nRBC: 0 % (ref 0.0–0.2)

## 2024-01-18 LAB — COMPREHENSIVE METABOLIC PANEL WITH GFR
ALT: 19 U/L (ref 0–44)
AST: 18 U/L (ref 15–41)
Albumin: 3.9 g/dL (ref 3.5–5.0)
Albumin: 3.9 g/dL (ref 3.5–5.0)
Alkaline Phosphatase: 92 U/L (ref 38–126)
Alkaline Phosphatase: 92 U/L (ref 38–126)
Anion gap: 9 (ref 5–15)
Anion gap: 9 (ref 5–15)
BUN: 7 mg/dL (ref 6–20)
BUN: 7 mg/dL (ref 6–20)
CO2: 23 mmol/L (ref 22–32)
CO2: 24 mmol/L (ref 22–32)
Calcium: 8.6 mg/dL — ABNORMAL LOW (ref 8.9–10.3)
Calcium: 8.6 mg/dL — ABNORMAL LOW (ref 8.9–10.3)
Chloride: 105 mmol/L (ref 98–111)
Chloride: 106 mmol/L (ref 98–111)
Creatinine, Ser: 1.02 mg/dL (ref 0.61–1.24)
Creatinine, Ser: 0.93 mg/dL (ref 0.61–1.24)
GFR, Estimated: >60 mL/min (ref >60)
GFR, Estimated: >60 mL/min (ref >60)
Glucose, Bld: 336 mg/dL — ABNORMAL HIGH (ref 70–99)
Glucose, Bld: 275 mg/dL — ABNORMAL HIGH (ref 70–99)
Potassium: 4.2 mmol/L (ref 3.5–5.1)
Sodium: 137 mmol/L (ref 135–145)
Sodium: 139 mmol/L (ref 135–145)
Total Bilirubin: 0.3 mg/dL (ref 0.0–1.2)
Total Bilirubin: 0.3 mg/dL (ref 0.0–1.2)
Total Protein: 6.7 g/dL (ref 6.5–8.1)
Total Protein: 6.6 g/dL (ref 6.5–8.1)

## 2024-01-18 LAB — LIPASE, BLOOD: Lipase: 21 U/L (ref 11–51)

## 2024-01-18 LAB — URINALYSIS, ROUTINE W REFLEX MICROSCOPIC
Bacteria, UA: NONE SEEN
Bilirubin Urine: NEGATIVE
Glucose, UA: >=500 mg/dL — AB
Hgb urine dipstick: NEGATIVE
Ketones, ur: NEGATIVE mg/dL
Leukocytes,Ua: NEGATIVE
Nitrite: NEGATIVE
Protein, ur: NEGATIVE mg/dL
Specific Gravity, Urine: 1.024 (ref 1.005–1.030)
pH: 6 (ref 5.0–8.0)

## 2024-01-18 LAB — BLOOD GAS, VENOUS
Acid-Base Excess: 2.4 mmol/L — ABNORMAL HIGH (ref 0.0–2.0)
Bicarbonate: 26.4 mmol/L (ref 20.0–28.0)
Drawn by: 7355
O2 Saturation: 94.3 %
Patient temperature: 37.2
pCO2, Ven: 38 mmHg — ABNORMAL LOW (ref 44–60)
pH, Ven: 7.45 — ABNORMAL HIGH (ref 7.25–7.43)
pO2, Ven: 69 mmHg — ABNORMAL HIGH (ref 32–45)

## 2024-01-18 LAB — TROPONIN T, HIGH SENSITIVITY: Troponin T High Sensitivity: 21 ng/L — ABNORMAL HIGH (ref 0–19)

## 2024-01-18 LAB — CBG MONITORING, ED
Glucose-Capillary: 307 mg/dL — ABNORMAL HIGH (ref 70–99)
Glucose-Capillary: 274 mg/dL — ABNORMAL HIGH (ref 70–99)

## 2024-01-18 MED ORDER — MORPHINE SULFATE (PF) 4 MG/ML IV SOLN
4.0000 mg | Freq: Once | INTRAVENOUS | Status: AC
  Administered 2024-01-18: 4 mg via INTRAVENOUS
  Filled 2024-01-18: qty 1

## 2024-01-18 MED ORDER — ATORVASTATIN CALCIUM 10 MG PO TABS: 10.0000 mg | ORAL_TABLET | Freq: Every day | ORAL | 0 refills | Status: AC

## 2024-01-18 MED ORDER — LABETALOL HCL 5 MG/ML IV SOLN
10.0000 mg | Freq: Once | INTRAVENOUS | Status: AC
  Administered 2024-01-18: 10 mg via INTRAVENOUS
  Filled 2024-01-18: qty 4

## 2024-01-18 MED ORDER — LISINOPRIL 20 MG PO TABS: 20.0000 mg | ORAL_TABLET | Freq: Every day | ORAL | 0 refills | Status: AC

## 2024-01-18 MED ORDER — INSULIN ASPART 100 UNIT/ML IJ SOLN
10.0000 [IU] | Freq: Once | INTRAMUSCULAR | Status: AC
  Administered 2024-01-18: 5 [IU] via SUBCUTANEOUS
  Filled 2024-01-18: qty 1

## 2024-01-18 MED ORDER — AMLODIPINE BESYLATE 10 MG PO TABS: 10.0000 mg | ORAL_TABLET | Freq: Every day | ORAL | 0 refills | Status: AC

## 2024-01-18 MED ORDER — LABETALOL HCL 5 MG/ML IV SOLN: 10.0000 mg | Freq: Once | INTRAVENOUS | Status: DC

## 2024-01-18 MED ORDER — METFORMIN HCL 1000 MG PO TABS: 1000.0000 mg | ORAL_TABLET | Freq: Two times a day (BID) | ORAL | 0 refills | Status: AC

## 2024-01-18 MED ORDER — SODIUM CHLORIDE 0.9 % IV BOLUS
500.0000 mL | Freq: Once | INTRAVENOUS | Status: AC
  Administered 2024-01-18: 500 mL via INTRAVENOUS

## 2024-01-18 MED ORDER — METOPROLOL TARTRATE 25 MG PO TABS: 25.0000 mg | ORAL_TABLET | Freq: Two times a day (BID) | ORAL | 0 refills | Status: AC

## 2024-01-18 MED ORDER — ONDANSETRON HCL 4 MG/2ML IJ SOLN
4.0000 mg | Freq: Once | INTRAMUSCULAR | Status: AC
  Administered 2024-01-18: 4 mg via INTRAVENOUS
  Filled 2024-01-18: qty 2

## 2024-01-18 MED ORDER — AMLODIPINE BESYLATE 5 MG PO TABS: 10.0000 mg | ORAL_TABLET | Freq: Once | ORAL | Status: DC

## 2024-01-18 MED ORDER — LISINOPRIL 10 MG PO TABS
20.0000 mg | ORAL_TABLET | Freq: Once | ORAL | Status: AC
  Administered 2024-01-18: 20 mg via ORAL
  Filled 2024-01-18: qty 2

## 2024-01-18 MED ORDER — BLOOD GLUCOSE METER KIT: PACK | 0 refills | Status: AC

## 2024-01-18 NOTE — ED Notes (Signed)
 Pt/family received d/c paperwork at this time. After going over the paperwork any questions, comments, or concerns were answered to the best of this nurse's knowledge. The pt/family verbally acknowledged the teachings/instructions.

## 2024-01-18 NOTE — ED Triage Notes (Signed)
 Pt c/o of left sided chest pain x a few days. Pt also states he has been out of BP meds, Diabetic meds and glucose test strips for a few days and thinks his cbg is high.

## 2024-01-18 NOTE — ED Provider Notes (Signed)
 [place] EMERGENCY DEPARTMENT AT [facility] Provider Note   CSN: [id number] Arrival date & time: 01/18/24  1319     Patient presents with: Chest Pain   Miguel Elliott. is a 48 y.o. male.   Patient is a 48 year old male with a past medical history of hypertension, GERD, diabetes who presents to the emergency department with a chief complaint of pain to bilateral hands and feet, chest pain, shortness of breath which has been ongoing for approximate the past 3 days.  Patient notes that he has been out of his home medications.  He notes that he has not been following up with his primary care doctor.  Patient notes that he has had no abdominal pain, nausea, vomiting, diarrhea.  He notes that he last checked his blood sugar approximate 3 days ago at home.  Patient denies any associated dizziness, lightheadedness or syncope.   Chest Pain      Prior to Admission medications   Medication Sig Start Date End Date Taking? Authorizing Provider  amLODipine  (NORVASC ) 10 MG tablet Take 1 tablet (10 mg total) by mouth daily. 11/08/22   Melvenia Motto, MD  atorvastatin  (LIPITOR) 10 MG tablet Take 1 tablet (10 mg total) by mouth daily at 6 PM. 11/08/22   Melvenia Motto, MD  blood glucose meter kit and supplies Dispense based on patient and insurance preference. Use up to four times daily as directed. (FOR ICD-10 E10.9, E11.9). 05/24/19   Johnson, Clanford L, MD  cephALEXin  (KEFLEX ) 500 MG capsule Take 1 capsule (500 mg total) by mouth 4 (four) times daily. 07/16/23   Melvenia Motto, MD  clonazePAM  (KLONOPIN ) 0.5 MG tablet Take 1 tablet (0.5 mg total) by mouth 2 (two) times daily as needed for anxiety. 07/08/20   Zammit, Joseph, MD  lisinopril  (ZESTRIL ) 20 MG tablet Take 1 tablet (20 mg total) by mouth daily. 11/08/22   Melvenia Motto, MD  metFORMIN  (GLUCOPHAGE  XR) 500 MG 24 hr tablet Take 1 tablet (500 mg total) by mouth 2 (two) times daily with a meal. 05/24/19 01/17/20  Johnson, Clanford L, MD  metoprolol   tartrate (LOPRESSOR ) 25 MG tablet Take 1 tablet (25 mg total) by mouth 2 (two) times daily. 11/08/22 12/08/22  Melvenia Motto, MD  pantoprazole  (PROTONIX ) 40 MG tablet Take 1 tablet (40 mg total) by mouth daily. 11/08/22   Melvenia Motto, MD  spironolactone  (ALDACTONE ) 50 MG tablet Take 50 mg by mouth daily. 11/18/19   [provider]    Allergies: Patient has no known allergies.    Review of Systems  Cardiovascular:  Positive for chest pain.  All other systems reviewed and are negative.   Updated Vital Signs BP (!) 204/108   Pulse 95   Temp 99 F (37.2 C) (Oral)   Resp 16   Ht 5' 9 (1.753 m)   Wt 112 kg   SpO2 97%   BMI 36.46 kg/m   Physical Exam Vitals and nursing note reviewed.  Constitutional:      General: He is not in acute distress.    Appearance: Normal appearance. He is not ill-appearing.  HENT:     Head: Normocephalic and atraumatic.     Nose: Nose normal.     Mouth/Throat:     Mouth: Mucous membranes are moist.  Eyes:     Extraocular Movements: Extraocular movements intact.     Conjunctiva/sclera: Conjunctivae normal.     Pupils: Pupils are equal, round, and reactive to light.  Cardiovascular:  Rate and Rhythm: Normal rate and regular rhythm.     Pulses: Normal pulses.     Heart sounds: Normal heart sounds. Heart sounds not distant. No murmur heard. Pulmonary:     Effort: Pulmonary effort is normal. No tachypnea.     Breath sounds: Normal breath sounds. No stridor. No decreased breath sounds, wheezing, rhonchi or rales.  Chest:     Chest wall: No tenderness.  Abdominal:     General: Abdomen is flat. Bowel sounds are normal.     Palpations: Abdomen is soft. There is no mass.     Tenderness: There is no abdominal tenderness. There is no guarding.  Musculoskeletal:        General: Normal range of motion.     Cervical back: Normal range of motion and neck supple.     Right lower leg: No tenderness. No edema.     Left lower leg: No tenderness. No edema.   Skin:    General: Skin is warm and dry.     Findings: No ecchymosis or rash.  Neurological:     General: No focal deficit present.     Mental Status: He is alert and oriented to person, place, and time. Mental status is at baseline.  Psychiatric:        Mood and Affect: Mood normal.        Behavior: Behavior normal.        Thought Content: Thought content normal.        Judgment: Judgment normal.     (all labs ordered are listed, but only abnormal results are displayed) Labs Reviewed  CBG MONITORING, ED - Abnormal; Notable for the following components:      Result Value   Glucose-Capillary 307 (*)    All other components within normal limits  COMPREHENSIVE METABOLIC PANEL WITH GFR  CBC WITH DIFFERENTIAL/PLATELET  URINALYSIS, ROUTINE W REFLEX MICROSCOPIC  BLOOD GAS, VENOUS  LIPASE, BLOOD  TROPONIN T, HIGH SENSITIVITY    EKG: None  Radiology: No results found.   Procedures   Medications Ordered in the ED  sodium chloride  0.9 % bolus 500 mL (has no administration in time range)  labetalol  (NORMODYNE ) injection 10 mg (has no administration in time range)  lisinopril  (ZESTRIL ) tablet 20 mg (has no administration in time range)                                    Medical Decision Making Amount and/or Complexity of Data Reviewed Labs: ordered. Radiology: ordered.  Risk OTC drugs. Prescription drug management.   This patient presents to the ED for concern of chest pain differential diagnosis includes ACS, pulmonary embolus, pericarditis, myocarditis, endocarditis, aortic aneurysm or dissection, pneumonia, pneumothorax, hemothorax, chest wall pain, hypertension    Additional history obtained:  Additional history obtained from medical records External records from outside source obtained and reviewed including medical records   Lab Tests:  I Ordered, and personally interpreted labs.  The pertinent results include: No leukocytosis, no anemia, normal kidney  function liver function, normal electrolytes, elevated glucose, unremarkable urinalysis, negative lipase, stable serial troponins   Imaging Studies ordered:  I ordered imaging studies including chest x-ray I independently visualized and interpreted imaging which showed no acute cardiopulmonary process I agree with the radiologist interpretation   Medicines ordered and prescription drug management:  I ordered medication including labetalol , lisinopril , morphine, Zofran , IV fluids, insulin  for hypertension, chest pain, hyperglycemia  Reevaluation of the patient after these medicines showed that the patient improved I have reviewed the patients home medicines and have made adjustments as needed   Problem List / ED Course:  Patient is doing well at this time and is stable for discharge home.  Chest pain has completely resolved with blood pressure control in the emergency department.  Discussed with patient I do suspect that the tingling in the hands and feet are consistent with peripheral neuropathy given his history of diabetes and uncontrolled blood sugar.  He has no indication for DKA or HHS at this time.  Low suspicion for ACS at this point.  Initial troponin was 16 which was canceled by the lab and second troponin was 21.  Did discuss patient case with Dr. Kate with cardiology as well who did recommend outpatient cardiology follow-up and notes that patient does not warrant admission at this time given the fact that he is chest pain free after blood pressure control.  Will represcribe the patient's blood pressure medications which he has been out of for the past 3 days as well as up his metformin  dose.  Did place ambulatory referral for cardiology and provided information for follow-up with a new primary care doctor as he notes he does not want to see his old 1.  Patient has no positional pain and do not suspect pericarditis or myocarditis.  Symptoms are nonexertional in nature.  Low suspicion  for aortic aneurysm or dissection, pulmonary embolus or endocarditis in this patient.  Strict turn precautions were provided for any new or worsening symptoms.  Patient voiced understanding and had no additional questions.   Social Determinants of Health:  None        Final diagnoses:  None    ED Discharge Orders     None          Daralene Lonni JONETTA DEVONNA 01/18/24 1759    Towana Ozell BROCKS, MD 01/19/24 1018

## 2024-01-18 NOTE — Discharge Instructions (Signed)
 Please follow-up closely with a primary care doctor and with cardiology on an outpatient basis.  Return to emergency department immediately for any new or worsening symptoms.  [facility] Primary Care Doctor List    Rollene Pesa, MD. Specialty: [facility] Medicine Contact information: [address]  [place] [address]  [phone]   Glendia Fielding, MD. Specialty: [facility] Contact information: [address]  [place] [address]  [phone]   Benita Outhouse, MD Specialty: Internal Medicine Contact information: [address] [place] [address]  [phone]   Darlyn Hurst, MD. Specialty: Internal Medicine Contact information: [address]  [place] [address]  [phone]    [facility] (Dr. Luke) Specialty: Family Medicine Contact information: [address]  [place] [address]  [phone]   Garnette Lolling, MD. Specialty: [facility] Medicine Contact information: [address]  [address]  [place] [address]  [phone]   Gaither Langton, MD. Specialty: Internal Medicine Contact information: [address]  [address]  [place] [address]  [phone]   [facility]: [address] [phone]  [place], Family medicine [address]  [phone]  [facility] [address] [place], [place] [phone]  [facility]: [address] [phone]    [facility]  [address] [place], [place] [address] [phone]  Services [facility] offers a variety of basic health services.  Services include but are not limited to: Blood pressure checks  Heart rate checks  Blood sugar checks  Urine analysis  Rapid strep tests  Pregnancy tests.  Health education and referrals  People needing more complex services will be directed to a physician online. Using these virtual visits,  doctors can evaluate and prescribe medicine and treatments. There will be no medication on-site, though [facility] will help patients fill their prescriptions at little to no cost.   For More information please go to: [email or web address]  Allergy and Asthma:    [address] [place] [phone]  Urology:  [address]  [place] [phone]  [facility]  [address] [place], [place] [phone]  Orthopedics   [address] [place], [place] [phone]  Endocrinology  [address] [place], [place] [phone]  Podiatry: [facility] [phone]

## 2024-02-04 ENCOUNTER — Encounter: Payer: Self-pay | Admitting: General Practice

## 2024-04-29 ENCOUNTER — Ambulatory Visit: Payer: MEDICAID | Admitting: Cardiology

## 2024-04-29 NOTE — Progress Notes (Unsigned)
 "     Clinical Summary Mr. Bonet is a 49 y.o.male seen as a new patient, former patient of Dr Charls last seen in our office 08/2020. Seen today for the following medical problems  1.Chest pain - initial evaluation in 2021 by Dr Charls - prior admission 05/2019 with chest pain in setting of hypretensive urgency - 05/2019 echo: LVEF 50-55%, no WMAs, grade I dd, normal RV function - was to have outpatient stress test but was not completed  - ER visit 01/2024 with chest pain and SOB, had been out of his meds for 3 days.  -bp was 204/108, chest pain resolved with bp control   2.HTN   3. Substance abuse - notes mention history of cocaine, EtoH abuse     Past Medical History:  Diagnosis Date   Abnormal weight loss    30 pounds since 11/2010   Anxiety    CHF (congestive heart failure) (HCC)    Cocaine use    COPD (chronic obstructive pulmonary disease) (HCC)    Depression    no medication   Diabetes mellitus (HCC)    Esophageal varices (HCC) 12/22/10    grade 1 egd by Dr. Shaaron   Gastric erosions 12/22/10    egd by Dr. Shaaron   GERD (gastroesophageal reflux disease)    Hiatal hernia 12/22/10    egd by Dr. Shaaron   Hypertension    Hypertensive heart disease without CHF    Mallory - Weiss tear 12/22/10   egd by Dr. Shaaron   Myocardial infarct [facility]) 06/2019   Portal hypertensive gastropathy    PTSD (post-traumatic stress disorder)    SVT (supraventricular tachycardia)    Tobacco abuse      Allergies[1]   Current Outpatient Medications  Medication Sig Dispense Refill   amLODipine  (NORVASC ) 10 MG tablet Take 1 tablet (10 mg total) by mouth daily. 30 tablet 0   atorvastatin  (LIPITOR) 10 MG tablet Take 1 tablet (10 mg total) by mouth daily at 6 PM. 30 tablet 0   blood glucose meter kit and supplies Dispense based on patient and insurance preference. Use up to four times daily as directed. (FOR ICD-10 E10.9, E11.9). 1 each 0   cephALEXin  (KEFLEX ) 500 MG capsule Take 1  capsule (500 mg total) by mouth 4 (four) times daily. 20 capsule 0   clonazePAM  (KLONOPIN ) 0.5 MG tablet Take 1 tablet (0.5 mg total) by mouth 2 (two) times daily as needed for anxiety. 20 tablet 0   lisinopril  (ZESTRIL ) 20 MG tablet Take 1 tablet (20 mg total) by mouth daily. 30 tablet 0   metFORMIN  (GLUCOPHAGE ) 1000 MG tablet Take 1 tablet (1,000 mg total) by mouth 2 (two) times daily. 60 tablet 0   metoprolol  tartrate (LOPRESSOR ) 25 MG tablet Take 1 tablet (25 mg total) by mouth 2 (two) times daily. 60 tablet 0   pantoprazole  (PROTONIX ) 40 MG tablet Take 1 tablet (40 mg total) by mouth daily. 30 tablet 1   spironolactone  (ALDACTONE ) 50 MG tablet Take 50 mg by mouth daily.     No current facility-administered medications for this visit.     Past Surgical History:  Procedure Laterality Date   ESOPHAGOGASTRODUODENOSCOPY  12/22/2010   Procedure: ESOPHAGOGASTRODUODENOSCOPY (EGD);  Surgeon: Lamar CHRISTELLA Shaaron, MD;  Location: [facility];  Service: Endoscopy;  Laterality: N/A;.    ERE, M-W tear, gastric erosions, bx benign, minimal esophageal variecs, portal gastropathy   FOOT SURGERY     NASAL RECONSTRUCTION  2003  Allergies[2]    Family History  Problem Relation Age of Onset   Hypertension Father    Diabetes Father    Hypertension Paternal Grandmother    Diabetes Maternal Grandmother    Colon cancer Neg Hx    Ulcers Neg Hx    Colon polyps Neg Hx      Social History Mr. Spark reports that he has been smoking cigarettes. He has never used smokeless tobacco. Mr. Manthei reports that he does not currently use alcohol after a past usage of about 15.0 standard drinks of alcohol per week.   Review of Systems CONSTITUTIONAL: No weight loss, fever, chills, weakness or fatigue.  HEENT: Eyes: No visual loss, blurred vision, double vision or yellow sclerae.No hearing loss, sneezing, congestion, runny nose or sore throat.  SKIN: No rash or itching.  CARDIOVASCULAR:  RESPIRATORY: No  shortness of breath, cough or sputum.  GASTROINTESTINAL: No anorexia, nausea, vomiting or diarrhea. No abdominal pain or blood.  GENITOURINARY: No burning on urination, no polyuria NEUROLOGICAL: No headache, dizziness, syncope, paralysis, ataxia, numbness or tingling in the extremities. No change in bowel or bladder control.  MUSCULOSKELETAL: No muscle, back pain, joint pain or stiffness.  LYMPHATICS: No enlarged nodes. No history of splenectomy.  PSYCHIATRIC: No history of depression or anxiety.  ENDOCRINOLOGIC: No reports of sweating, cold or heat intolerance. No polyuria or polydipsia.  SABRA   Physical Examination There were no vitals filed for this visit. There were no vitals filed for this visit.  Gen: resting comfortably, no acute distress HEENT: no scleral icterus, pupils equal round and reactive, no palptable cervical adenopathy,  CV Resp: Clear to auscultation bilaterally GI: abdomen is soft, non-tender, non-distended, normal bowel sounds, no hepatosplenomegaly MSK: extremities are warm, no edema.  Skin: warm, no rash Neuro:  no focal deficits Psych: appropriate affect   Diagnostic Studies     Assessment and Plan        Dorn PHEBE Ross, M.D., F.A.C.C.    [1] No Known Allergies [2] No Known Allergies  "

## 2024-05-12 ENCOUNTER — Ambulatory Visit: Payer: MEDICAID | Admitting: Cardiology
